# Patient Record
Sex: Male | Born: 1935 | Race: Black or African American | Hispanic: No | Marital: Married | Smoking: Never smoker
Health system: Southern US, Community
[De-identification: ages and names within clinical notes are randomized; demographics above are authoritative.]

## PROBLEM LIST (undated history)

## (undated) DIAGNOSIS — E7800 Pure hypercholesterolemia, unspecified: Secondary | ICD-10-CM

## (undated) HISTORY — PX: ROTATOR CUFF REPAIR: SHX139

## (undated) HISTORY — PX: COLON RESECTION: SHX5231

---

## 2015-04-24 ENCOUNTER — Inpatient Hospital Stay: Admit: 2015-04-24 | Payer: Self-pay | Admitting: Surgery

## 2015-04-24 ENCOUNTER — Encounter
Admission: EM | Disposition: A | Discharge status: Home / Self Care | Payer: Self-pay | Source: Home / Self Care | Attending: Surgery

## 2015-04-24 ENCOUNTER — Emergency Department: Payer: Medicare Other

## 2015-04-24 ENCOUNTER — Inpatient Hospital Stay: Payer: Medicare Other | Admitting: Anesthesiology

## 2015-04-24 ENCOUNTER — Inpatient Hospital Stay
Admission: EM | Admit: 2015-04-24 | Discharge: 2015-05-08 | DRG: 327 | Disposition: A | Discharge status: Home / Self Care | Payer: Medicare Other | Attending: Surgery | Admitting: Surgery

## 2015-04-24 ENCOUNTER — Encounter: Payer: Self-pay | Admitting: *Deleted

## 2015-04-24 DIAGNOSIS — K265 Chronic or unspecified duodenal ulcer with perforation: Principal | ICD-10-CM | POA: Diagnosis present

## 2015-04-24 DIAGNOSIS — B9681 Helicobacter pylori [H. pylori] as the cause of diseases classified elsewhere: Secondary | ICD-10-CM | POA: Diagnosis present

## 2015-04-24 DIAGNOSIS — J939 Pneumothorax, unspecified: Secondary | ICD-10-CM | POA: Diagnosis not present

## 2015-04-24 DIAGNOSIS — R400 Somnolence: Secondary | ICD-10-CM | POA: Diagnosis not present

## 2015-04-24 DIAGNOSIS — E785 Hyperlipidemia, unspecified: Secondary | ICD-10-CM | POA: Diagnosis present

## 2015-04-24 DIAGNOSIS — B9689 Other specified bacterial agents as the cause of diseases classified elsewhere: Secondary | ICD-10-CM | POA: Diagnosis present

## 2015-04-24 DIAGNOSIS — R000 Tachycardia, unspecified: Secondary | ICD-10-CM | POA: Diagnosis not present

## 2015-04-24 DIAGNOSIS — D72829 Elevated white blood cell count, unspecified: Secondary | ICD-10-CM | POA: Diagnosis present

## 2015-04-24 DIAGNOSIS — K91841 Postprocedural hemorrhage and hematoma of a digestive system organ or structure following other procedure: Secondary | ICD-10-CM | POA: Diagnosis present

## 2015-04-24 DIAGNOSIS — E780 Pure hypercholesterolemia: Secondary | ICD-10-CM | POA: Diagnosis present

## 2015-04-24 DIAGNOSIS — R198 Other specified symptoms and signs involving the digestive system and abdomen: Secondary | ICD-10-CM

## 2015-04-24 DIAGNOSIS — R69 Illness, unspecified: Secondary | ICD-10-CM | POA: Diagnosis present

## 2015-04-24 DIAGNOSIS — R079 Chest pain, unspecified: Secondary | ICD-10-CM | POA: Diagnosis present

## 2015-04-24 DIAGNOSIS — K631 Perforation of intestine (nontraumatic): Secondary | ICD-10-CM | POA: Diagnosis present

## 2015-04-24 DIAGNOSIS — D649 Anemia, unspecified: Secondary | ICD-10-CM | POA: Diagnosis not present

## 2015-04-24 DIAGNOSIS — R140 Abdominal distension (gaseous): Secondary | ICD-10-CM

## 2015-04-24 DIAGNOSIS — K254 Chronic or unspecified gastric ulcer with hemorrhage: Secondary | ICD-10-CM | POA: Diagnosis present

## 2015-04-24 DIAGNOSIS — I959 Hypotension, unspecified: Secondary | ICD-10-CM | POA: Diagnosis not present

## 2015-04-24 DIAGNOSIS — K769 Liver disease, unspecified: Secondary | ICD-10-CM | POA: Diagnosis present

## 2015-04-24 DIAGNOSIS — R066 Hiccough: Secondary | ICD-10-CM | POA: Diagnosis not present

## 2015-04-24 DIAGNOSIS — E440 Moderate protein-calorie malnutrition: Secondary | ICD-10-CM | POA: Diagnosis present

## 2015-04-24 DIAGNOSIS — K275 Chronic or unspecified peptic ulcer, site unspecified, with perforation: Secondary | ICD-10-CM | POA: Diagnosis not present

## 2015-04-24 DIAGNOSIS — R0602 Shortness of breath: Secondary | ICD-10-CM

## 2015-04-24 DIAGNOSIS — T82594S Other mechanical complication of infusion catheter, sequela: Secondary | ICD-10-CM

## 2015-04-24 HISTORY — PX: REPAIR OF PERFORATED ULCER: SHX6065

## 2015-04-24 HISTORY — DX: Pure hypercholesterolemia, unspecified: E78.00

## 2015-04-24 LAB — COMPREHENSIVE METABOLIC PANEL
ALT: 17 U/L (ref 17–63)
ANION GAP: 11 (ref 5–15)
AST: 33 U/L (ref 15–41)
Albumin: 4.2 g/dL (ref 3.5–5.0)
Alkaline Phosphatase: 44 U/L (ref 38–126)
BILIRUBIN TOTAL: 1 mg/dL (ref 0.3–1.2)
BUN: 14 mg/dL (ref 6–20)
CALCIUM: 9 mg/dL (ref 8.9–10.3)
CO2: 27 mmol/L (ref 22–32)
CREATININE: 1.15 mg/dL (ref 0.61–1.24)
Chloride: 101 mmol/L (ref 101–111)
GFR calc Af Amer: >60 mL/min (ref >60)
GFR calc non Af Amer: 58 mL/min — ABNORMAL LOW (ref >60)
Glucose, Bld: 201 mg/dL — ABNORMAL HIGH (ref 65–99)
Potassium: 4 mmol/L (ref 3.5–5.1)
SODIUM: 139 mmol/L (ref 135–145)
TOTAL PROTEIN: 8.2 g/dL — AB (ref 6.5–8.1)

## 2015-04-24 LAB — CBC WITH DIFFERENTIAL/PLATELET
Basophils Absolute: 0 10*3/uL (ref 0–0.1)
Basophils Relative: 0 %
Eosinophils Absolute: 0 10*3/uL (ref 0–0.7)
Eosinophils Relative: 0 %
HEMATOCRIT: 42.4 % (ref 40.0–52.0)
Hemoglobin: 13.9 g/dL (ref 13.0–18.0)
LYMPHS ABS: 1.5 10*3/uL (ref 1.0–3.6)
Lymphocytes Relative: 13 %
MCH: 31.6 pg (ref 26.0–34.0)
MCHC: 32.8 g/dL (ref 32.0–36.0)
MCV: 96.5 fL (ref 80.0–100.0)
Monocytes Absolute: 0.4 10*3/uL (ref 0.2–1.0)
Monocytes Relative: 4 %
Neutro Abs: 9.6 10*3/uL — ABNORMAL HIGH (ref 1.4–6.5)
Neutrophils Relative %: 83 %
Platelets: 250 10*3/uL (ref 150–440)
RBC: 4.39 MIL/uL — ABNORMAL LOW (ref 4.40–5.90)
RDW: 14.3 % (ref 11.5–14.5)
WBC: 11.5 10*3/uL — ABNORMAL HIGH (ref 3.8–10.6)

## 2015-04-24 LAB — GLUCOSE, CAPILLARY: Glucose-Capillary: 185 mg/dL — ABNORMAL HIGH (ref 65–99)

## 2015-04-24 LAB — TROPONIN I

## 2015-04-24 SURGERY — REPAIR, ULCER, PEPTIC, PERFORATED
Anesthesia: General | Wound class: Clean Contaminated

## 2015-04-24 MED ORDER — ENOXAPARIN SODIUM 40 MG/0.4ML ~~LOC~~ SOLN
40.0000 mg | SUBCUTANEOUS | Status: DC
  Administered 2015-04-25 – 2015-04-29 (×5): 40 mg via SUBCUTANEOUS
  Filled 2015-04-24 (×6): qty 0.4

## 2015-04-24 MED ORDER — MORPHINE SULFATE 4 MG/ML IJ SOLN
4.0000 mg | Freq: Once | INTRAMUSCULAR | Status: AC
  Administered 2015-04-24: 4 mg via INTRAVENOUS

## 2015-04-24 MED ORDER — MORPHINE SULFATE 4 MG/ML IJ SOLN
INTRAMUSCULAR | Status: AC
  Administered 2015-04-24: 4 mg via INTRAVENOUS
  Filled 2015-04-24: qty 1

## 2015-04-24 MED ORDER — SODIUM CHLORIDE 0.9 % IV SOLN
INTRAVENOUS | Status: DC | PRN
  Administered 2015-04-24 – 2015-04-25 (×2): via INTRAVENOUS

## 2015-04-24 MED ORDER — FENTANYL CITRATE (PF) 100 MCG/2ML IJ SOLN: INTRAMUSCULAR | Status: DC | PRN

## 2015-04-24 MED ORDER — IOHEXOL 240 MG/ML SOLN
25.0000 mL | Freq: Once | INTRAMUSCULAR | Status: AC | PRN
  Administered 2015-04-24: 25 mL via ORAL

## 2015-04-24 MED ORDER — ONDANSETRON HCL 4 MG/2ML IJ SOLN
INTRAMUSCULAR | Status: AC
  Administered 2015-04-24: 4 mg via INTRAVENOUS
  Filled 2015-04-24: qty 2

## 2015-04-24 MED ORDER — HYDROMORPHONE HCL 1 MG/ML IJ SOLN: 1.0000 mg | INTRAMUSCULAR | Status: DC | PRN

## 2015-04-24 MED ORDER — PIPERACILLIN-TAZOBACTAM 4.5 G IVPB
4.5000 g | Freq: Once | INTRAVENOUS | Status: AC
  Administered 2015-04-24: 4.5 g via INTRAVENOUS
  Filled 2015-04-24: qty 100

## 2015-04-24 MED ORDER — SODIUM CHLORIDE 0.9 % IV BOLUS (SEPSIS)
1000.0000 mL | Freq: Once | INTRAVENOUS | Status: AC
  Administered 2015-04-24: 1000 mL via INTRAVENOUS

## 2015-04-24 MED ORDER — PHENYLEPHRINE HCL 10 MG/ML IJ SOLN
INTRAMUSCULAR | Status: DC | PRN
  Administered 2015-04-24 (×3): 100 ug via INTRAVENOUS

## 2015-04-24 MED ORDER — KCL IN DEXTROSE-NACL 20-5-0.45 MEQ/L-%-% IV SOLN
INTRAVENOUS | Status: DC
  Administered 2015-04-25 – 2015-04-26 (×4): via INTRAVENOUS
  Administered 2015-04-27: 1000 mL via INTRAVENOUS
  Administered 2015-04-27 – 2015-04-30 (×5): via INTRAVENOUS
  Filled 2015-04-24 (×18): qty 1000

## 2015-04-24 MED ORDER — ONDANSETRON HCL 4 MG/2ML IJ SOLN
4.0000 mg | Freq: Once | INTRAMUSCULAR | Status: AC
  Administered 2015-04-24: 4 mg via INTRAVENOUS

## 2015-04-24 MED ORDER — IOHEXOL 300 MG/ML  SOLN
100.0000 mL | Freq: Once | INTRAMUSCULAR | Status: AC | PRN
  Administered 2015-04-24: 100 mL via INTRAVENOUS

## 2015-04-24 MED ORDER — SODIUM CHLORIDE 0.9 % IV SOLN
1.0000 g | INTRAVENOUS | Status: DC
  Administered 2015-04-25: 1 g via INTRAVENOUS
  Filled 2015-04-24: qty 1

## 2015-04-24 SURGICAL SUPPLY — 47 items
CANISTER SUCT 1200ML W/VALVE (MISCELLANEOUS) ×3 IMPLANT
CATH TRAY 16F METER LATEX (MISCELLANEOUS) ×3 IMPLANT
CLIP TI LARGE 6 (CLIP) IMPLANT
CLIP TI MEDIUM 6 (CLIP) IMPLANT
COVER CLAMP SIL LG PBX B (MISCELLANEOUS) IMPLANT
DRAPE INCISE IOBAN 66X45 STRL (DRAPES) ×3 IMPLANT
DRAPE LAPAROTOMY 100X77 ABD (DRAPES) ×3 IMPLANT
DRSG OPSITE POSTOP 4X10 (GAUZE/BANDAGES/DRESSINGS) ×6 IMPLANT
DRSG OPSITE POSTOP 4X8 (GAUZE/BANDAGES/DRESSINGS) ×3 IMPLANT
ELECT CAUTERY NEEDLE TIP 1.0 (MISCELLANEOUS)
ELECTRODE CAUTERY NEDL TIP 1.0 (MISCELLANEOUS) IMPLANT
GAUZE SPONGE 4X4 12PLY STRL (GAUZE/BANDAGES/DRESSINGS) ×3 IMPLANT
GLOVE BIO SURGEON STRL SZ7.5 (GLOVE) ×12 IMPLANT
GLOVE INDICATOR 8.0 STRL GRN (GLOVE) ×12 IMPLANT
GOWN STRL REUS W/ TWL LRG LVL3 (GOWN DISPOSABLE) ×12 IMPLANT
GOWN STRL REUS W/TWL LRG LVL3 (GOWN DISPOSABLE) ×6
HANDLE YANKAUER SUCT BULB TIP (MISCELLANEOUS) ×3 IMPLANT
KIT RM TURNOVER STRD PROC AR (KITS) ×3 IMPLANT
LABEL OR SOLS (LABEL) ×3 IMPLANT
LIGASURE 5MM LAPAROSCOPIC (INSTRUMENTS) IMPLANT
NS IRRIG 1000ML POUR BTL (IV SOLUTION) ×3 IMPLANT
PACK BASIN MAJOR ARMC (MISCELLANEOUS) ×3 IMPLANT
PACK COLON CLEAN CLOSURE (MISCELLANEOUS) ×3 IMPLANT
PAD ABD DERMACEA PRESS 5X9 (GAUZE/BANDAGES/DRESSINGS) ×3 IMPLANT
PAD GROUND ADULT SPLIT (MISCELLANEOUS) ×3 IMPLANT
RETAINER VISCERA MED (MISCELLANEOUS) ×3 IMPLANT
SPONGE LAP 18X18 5 PK (GAUZE/BANDAGES/DRESSINGS) IMPLANT
STAPLER SKIN PROX 35W (STAPLE) ×3 IMPLANT
STRIP CLOSURE SKIN 1/2X4 (GAUZE/BANDAGES/DRESSINGS) ×12 IMPLANT
SUT BOLSTER W/RETENTN TUBE (SUTURE) IMPLANT
SUT ETHILON 3-0 FS-10 30 BLK (SUTURE)
SUT MAXON ABS #0 GS21 30IN (SUTURE) ×12 IMPLANT
SUT NYLON 2-0 (SUTURE) IMPLANT
SUT PROLENE 2 TP 1 (SUTURE) IMPLANT
SUT SILK 2 0 (SUTURE)
SUT SILK 2-0 18XBRD TIE 12 (SUTURE) IMPLANT
SUT SILK 3 0 (SUTURE)
SUT SILK 3-0 (SUTURE) ×2
SUT SILK 3-0 18XBRD TIE 12 (SUTURE) IMPLANT
SUT SILK 3-0 SH-1 18XCR BRD (SUTURE) ×4
SUT SILK 4 0 (SUTURE)
SUT SILK 4-0 18XBRD TIE 12 (SUTURE) IMPLANT
SUT VIC AB 3-0 SH 27 (SUTURE) ×2
SUT VIC AB 3-0 SH 27X BRD (SUTURE) ×4 IMPLANT
SUT VIC AB 4-0 FS2 27 (SUTURE) IMPLANT
SUTURE EHLN 3-0 FS-10 30 BLK (SUTURE) IMPLANT
SUTURE SILK 3-0 SH-1 18XCR BRD (SUTURE) ×4 IMPLANT

## 2015-04-24 NOTE — Anesthesia Preprocedure Evaluation (Addendum)
Anesthesia Evaluation  Patient identified by MRN, date of birth, ID band Patient awake    Reviewed: Allergy & Precautions, NPO status , Patient's Chart, lab work & pertinent test results  Airway Mallampati: III       Dental no notable dental hx.    Pulmonary neg pulmonary ROS,    Pulmonary exam normal       Cardiovascular negative cardio ROS  Rhythm:Regular     Neuro/Psych    GI/Hepatic negative GI ROS, Neg liver ROS,   Endo/Other  negative endocrine ROS  Renal/GU negative Renal ROS  negative genitourinary   Musculoskeletal negative musculoskeletal ROS (+)   Abdominal Normal abdominal exam  (+)   Peds negative pediatric ROS (+)  Hematology negative hematology ROS (+)   Anesthesia Other Findings   Reproductive/Obstetrics negative OB ROS                            Anesthesia Physical Anesthesia Plan  ASA: II and emergent  Anesthesia Plan: General   Post-op Pain Management:    Induction: Intravenous  Airway Management Planned: Oral ETT  Additional Equipment:   Intra-op Plan:   Post-operative Plan: Extubation in OR  Informed Consent: I have reviewed the patients History and Physical, chart, labs and discussed the procedure including the risks, benefits and alternatives for the proposed anesthesia with the patient or authorized representative who has indicated his/her understanding and acceptance.     Plan Discussed with: CRNA  Anesthesia Plan Comments:         Anesthesia Quick Evaluation

## 2015-04-24 NOTE — ED Notes (Signed)
Patient reports having abdominal pain in the lower quads.  Tender to the touch.  Per family, he was vomiting last night and only ate a bagel and a drank a coffee this morning.  Traveling from [place]. Upon arrival patient diaphoretic. CBG=185.  Pain 8/10

## 2015-04-24 NOTE — ED Notes (Addendum)
Blood not needed at this time. Already drawn previously.

## 2015-04-24 NOTE — ED Provider Notes (Addendum)
[facility] Emergency Department Provider Note  ____________________________________________  Time seen: Approximately 430 PM  I have reviewed the triage vital signs and the nursing notes.   HISTORY  Chief Complaint Chest Pain    HPI Michael Lowe is a 81 y.o. male with a history of HLD who presents to the emergency department today for 1 day of vomiting and abdominal pain. The patient began vomiting yesterday and had several vomiting episodes overnight. He was able to tolerate a bagel this morning but continued to have pain. He says that the pain is cramping and sharp and located to the lower abdomen. He denies any pain in his penis or testicles. He denies any dysuria. No diarrhea. No known sick contacts. No bilious or bloody vomitus. Has not had any abdominal surgeries. Patient is from [place] and does not have any established medical care in this region.   Past Medical History  Diagnosis Date  . Hypercholesteremia     There are no active problems to display for this patient.   Past Surgical History  Procedure Laterality Date  . Rotator cuff repair      Current Outpatient Rx  Name  Route  Sig  Dispense  Refill  . aspirin EC 81 MG tablet   Oral   Take 81 mg by mouth daily.         . fluticasone (FLONASE) 50 MCG/ACT nasal spray   Each Nare   Place 1 spray into both nostrils daily.      5   . meloxicam (MOBIC) 7.5 MG tablet   Oral   Take 1 tablet by mouth daily.      0   . simvastatin (ZOCOR) 40 MG tablet   Oral   Take 1 tablet by mouth at bedtime.      3     Allergies Review of patient's allergies indicates no known allergies.  History reviewed. No pertinent family history.  Social History History  Substance Use Topics  . Smoking status: Never Smoker   . Smokeless tobacco: Not on file  . Alcohol Use: Yes    Review of Systems Constitutional: No fever/chills Eyes: No visual changes. ENT: No sore  throat. Cardiovascular: Denies chest pain. Respiratory: Denies shortness of breath. Gastrointestinal:  No diarrhea.  No constipation. Genitourinary: Negative for dysuria. Musculoskeletal: Negative for back pain. Skin: Negative for rash. Neurological: Negative for headaches, focal weakness or numbness.  10-point ROS otherwise negative.  ____________________________________________   PHYSICAL EXAM:  VITAL SIGNS: ED Triage Vitals  Enc Vitals Group     BP 04/24/15 1601 138/66 mmHg     Pulse Rate 04/24/15 1601 67     Resp 04/24/15 1601 20     Temp 04/24/15 1601 98.1 F (36.7 C)     Temp Source 04/24/15 1601 Oral     SpO2 04/24/15 1601 97 %     Weight 04/24/15 1601 160 lb (72.576 kg)     Height 04/24/15 1601 6' (1.829 m)     Head Cir --      Peak Flow --      Pain Score 04/24/15 1558 8     Pain Loc --      Pain Edu? --      Excl. in GC? --     Constitutional: Alert and oriented. Well appearing and in no acute distress. Eyes: Conjunctivae are normal. PERRL. EOMI. Head: Atraumatic. Nose: No congestion/rhinnorhea. Mouth/Throat: Mucous membranes are moist.  Oropharynx non-erythematous. Neck: No stridor.   Cardiovascular:  Normal rate, regular rhythm. Grossly normal heart sounds.  Good peripheral circulation. Respiratory: Normal respiratory effort.  No retractions. Lungs CTAB. Gastrointestinal: Patient is guarding with diffuse tenderness. No distention.  No CVA tenderness. Musculoskeletal: No lower extremity tenderness nor edema.  No joint effusions. Neurologic:  Normal speech and language. No gross focal neurologic deficits are appreciated. Speech is normal. No gait instability. Skin:  Skin is warm, dry and intact. No rash noted. Psychiatric: Mood and affect are normal. Speech and behavior are normal.  ____________________________________________   LABS (all labs ordered are listed, but only abnormal results are displayed)  Labs Reviewed  GLUCOSE, CAPILLARY - Abnormal;  Notable for the following:    Glucose-Capillary 185 (*)    All other components within normal limits  CBC WITH DIFFERENTIAL/PLATELET - Abnormal; Notable for the following:    WBC 11.5 (*)    RBC 4.39 (*)    Neutro Abs 9.6 (*)    All other components within normal limits  COMPREHENSIVE METABOLIC PANEL - Abnormal; Notable for the following:    Glucose, Bld 201 (*)    Total Protein 8.2 (*)    GFR calc non Af Amer 58 (*)    All other components within normal limits  TROPONIN I   ____________________________________________  EKG  ED ECG REPORT I, Arelia LongestSchaevitz,  Banita Lehn M, the attending physician, personally viewed and interpreted this ECG.   Date: 04/24/2015  EKG Time: 1604  Rate: 70  Rhythm: normal sinus rhythm  Axis: Normal axis  Intervals:none  ST&T Change: No  ST elevations or depressions. No abnormal T-wave inversions.  ____________________________________________  RADIOLOGY  CAT scan of the abdomen with findings consistent with perforation of viscus. Unclear site but most suspicious along the greater curvature of the stomach and loop of small bowel in the left upper quadrant of the abdomen. ____________________________________________   PROCEDURES    ____________________________________________   INITIAL IMPRESSION / ASSESSMENT AND PLAN / ED COURSE  Pertinent labs & imaging results that were available during my care of the patient were reviewed by me and considered in my medical decision making (see chart for details).  ----------------------------------------- 8:48 PM on 04/24/2015 -----------------------------------------  Patient resting comfortably at this time. Reviewed the CAT scan results with the patient as well as the family. Family initially hesitant to stay here at [facility] because they're from [place]. However, I discussed this being a medical emergency requiring surgery and that delaying his treatment because of transport could result in catastrophic  consequences. The family is understanding and willingness to stay here and speak with Dr. Michela PitcherEly when he is out of surgery. I discussed the radiology findings with Dr. Michela PitcherEly and he will be down to see the patient as soon as he is out of surgery. ____________________________________________   FINAL CLINICAL IMPRESSION(S) / ED DIAGNOSES  Acute perforated viscus. Initial visit.      Myrna Blazeravid Matthew Jaelene Garciagarcia, MD 04/24/15 2051  At 920 the family decided that they would like to be transferred to [facility] because the patient has had past care there and they feel that the hospital has more staff to take care of the patient. I had an extensive conversation with the patient as well as his wife and daughter about the seriousness of his medical condition and the high risk of death even with prompt treatment. The family is insistent on being transferred.  I discussed the case with Dr.Weilbach, the surgeon on call at [facility] in [place] refused the transfer because of the patient's diagnosis  and risk for delayed of care. She did say that she would be willing to take the patient after surgery here at [facility]. I told Dr. Michela Pitcher about my conversation with Dr. Darnelle Catalan and he will discuss this issue further with the family. I called Dr. Michela Pitcher at 10 PM and he is now out of the operating room and says he will be in the emergency department within 15-20 minutes. Patient remains stable at this time with a normal heart rate and blood pressure. Family was updated about the [facility] hospital's refusal as well as willingness to patient after surgery and the time that Dr. Michela Pitcher should be down in the emergency department to see the patient.  Myrna Blazer, MD 04/24/15 [phone]

## 2015-04-24 NOTE — ED Notes (Signed)
States last c/o mid abd pain, has vomited,also developed ingestion feeling in chest

## 2015-04-24 NOTE — ED Notes (Signed)
Called the lab to find out where troponin and CMP results were. Said they were going to run them now.

## 2015-04-24 NOTE — ED Notes (Signed)
Called into patient's room due to patient's wife wanting to speak with EDP because she wants to sign patient out AMA and drive patient to [facility] [place].  Dr. Pershing ProudSchaevitz notified and went immediately to bedside to discuss with patient and family.

## 2015-04-24 NOTE — H&P (Signed)
Michael Lowe is a 81 y.o. male  24-36 hours of abdominal pain.  HPI: He was in usual state of good health until approximately 36 hours ago and began to develop some significant abdominal pain. He denies any fever or chills. He vomited profusely. He attended a funeral this afternoon and [facility] and was going home to [facility]. Because of his increasing abdominal pain he encouraged his family to stop in our facility for further evaluation.  He denies any previous similar abdominal problems. Denies history of hepatitis yellow jaundice pancreatitis peptic ulcer disease gallbladder disease or diverticulitis. He has had a previous colonoscopy 3 years ago which was unremarkable. The patient denies any previous abdominal surgery.  Workup in the emergency room slightly elevated white blood cell count. CT scan with by mouth contrast demonstrated significant amount of free air or free fluid and some thickened small bowel consistent with probable perforation. The possibility of peptic ulcer disease was felt to be much lower possibility. The surgical service was consulted.  Past Medical History  Diagnosis Date  . Hypercholesteremia    Past Surgical History  Procedure Laterality Date  . Rotator cuff repair     History   Social History  . Marital Status: Married    Spouse Name: N/A  . Number of Children: N/A  . Years of Education: N/A   Social History Main Topics  . Smoking status: Never Smoker   . Smokeless tobacco: Not on file  . Alcohol Use: Yes  . Drug Use: Not on file  . Sexual Activity: Not on file   Other Topics Concern  . None   Social History Narrative  . None     Review of Systems  Constitutional: Negative for fever, chills, weight loss and malaise/fatigue.  HENT: Negative for congestion.   Eyes: Negative for blurred vision and double vision.  Respiratory: Negative for cough.        Pain on deep inspiration  Cardiovascular: Negative for chest pain and  palpitations.  Gastrointestinal: Positive for nausea, vomiting and abdominal pain. Negative for heartburn, diarrhea and constipation.  Genitourinary: Positive for frequency. Negative for dysuria.  Musculoskeletal: Negative for back pain and joint pain.  Skin: Negative for itching and rash.  Neurological: Negative for dizziness, speech change, focal weakness and headaches.  Endo/Heme/Allergies: Negative for environmental allergies. Does not bruise/bleed easily.  Psychiatric/Behavioral: Negative for depression and memory loss. The patient does not have insomnia.      PHYSICAL EXAM: BP 136/77 mmHg  Pulse 82  Temp(Src) 98.1 F (36.7 C) (Oral)  Resp 21  Ht 6' (1.829 m)  Wt 72.576 kg (160 lb)  BMI 21.70 kg/m2  SpO2 99%  Physical Exam  Constitutional: He is oriented to person, place, and time. He appears well-developed. He appears distressed.  HENT:  Head: Atraumatic.  Eyes: EOM are normal. Pupils are equal, round, and reactive to light.  Neck: Normal range of motion. Neck supple.  Cardiovascular: Normal rate and normal heart sounds.   Pulmonary/Chest: Effort normal and breath sounds normal.  Pain was deep inspiration  Abdominal: He exhibits distension. There is tenderness. There is rebound and guarding.  Marked abdominal tenderness with a boardlike abdomen significant guarding in both lower quadrants with mild rebound throughout. He has hypoactive bowel sounds.  Musculoskeletal: Normal range of motion.  Neurological: He is alert and oriented to person, place, and time.  Skin: Skin is warm and dry.  Psychiatric: He has a normal mood and affect. His behavior is normal.  I have independently reviewed his CT scan. The does appear to be significant amount of free air or free fluid. There is no obvious site of perforation. His colon and stomach along with the duodenum looked uninvolved. The site of bowel thickening appears to be in left upper quadrant and small intestine. He does have a  boardlike abdomen slightly elevated white blood cell count.   Impression/Plan: He clearly has abdominal perforation. We have recommended exploratory laparotomy. He is from [place] and the family wanted transfer to [place] facility. Emergency room physicians contacted to [facility] facility in the on-call surgeon refused transfer in this setting. I certainly understand the concern with a life-threatening illness this situation. I recommended surgical intervention. We've talked with him at length and they have agreed. Risks benefits and options been outlined and accepted. His wife and daughter were present for the interview.   Tiney Rouge III, MD  04/24/2015, 10:32 PM

## 2015-04-24 NOTE — ED Notes (Signed)
Ct notified patient completed contrast. 

## 2015-04-25 ENCOUNTER — Encounter: Payer: Self-pay | Admitting: Surgery

## 2015-04-25 DIAGNOSIS — K275 Chronic or unspecified peptic ulcer, site unspecified, with perforation: Secondary | ICD-10-CM

## 2015-04-25 LAB — BASIC METABOLIC PANEL
Anion gap: 7 (ref 5–15)
BUN: 20 mg/dL (ref 6–20)
CO2: 22 mmol/L (ref 22–32)
Calcium: 7.5 mg/dL — ABNORMAL LOW (ref 8.9–10.3)
Chloride: 107 mmol/L (ref 101–111)
Creatinine, Ser: 1.09 mg/dL (ref 0.61–1.24)
GFR calc Af Amer: >60 mL/min (ref >60)
Glucose, Bld: 213 mg/dL — ABNORMAL HIGH (ref 65–99)
POTASSIUM: 4.8 mmol/L (ref 3.5–5.1)
Sodium: 136 mmol/L (ref 135–145)

## 2015-04-25 LAB — CBC
HEMATOCRIT: 40.4 % (ref 40.0–52.0)
HEMOGLOBIN: 13.3 g/dL (ref 13.0–18.0)
MCH: 31.9 pg (ref 26.0–34.0)
MCHC: 33 g/dL (ref 32.0–36.0)
MCV: 96.7 fL (ref 80.0–100.0)
Platelets: 208 10*3/uL (ref 150–440)
RBC: 4.18 MIL/uL — ABNORMAL LOW (ref 4.40–5.90)
RDW: 14.5 % (ref 11.5–14.5)
WBC: 7 10*3/uL (ref 3.8–10.6)

## 2015-04-25 MED ORDER — ROCURONIUM BROMIDE 100 MG/10ML IV SOLN
INTRAVENOUS | Status: DC | PRN
  Administered 2015-04-24: 40 mg via INTRAVENOUS

## 2015-04-25 MED ORDER — HYDROMORPHONE HCL 1 MG/ML IJ SOLN
1.0000 mg | INTRAMUSCULAR | Status: DC | PRN
  Administered 2015-04-25 – 2015-04-27 (×7): 1 mg via INTRAVENOUS
  Filled 2015-04-25 (×7): qty 1

## 2015-04-25 MED ORDER — FENTANYL CITRATE (PF) 100 MCG/2ML IJ SOLN
INTRAMUSCULAR | Status: DC | PRN
  Administered 2015-04-24 – 2015-04-25 (×3): 50 ug via INTRAVENOUS

## 2015-04-25 MED ORDER — NEOSTIGMINE METHYLSULFATE 10 MG/10ML IV SOLN
INTRAVENOUS | Status: DC | PRN
  Administered 2015-04-25: 3 mg via INTRAVENOUS

## 2015-04-25 MED ORDER — ONDANSETRON HCL 4 MG/2ML IJ SOLN
4.0000 mg | Freq: Once | INTRAMUSCULAR | Status: DC | PRN
Start: 2015-04-25 — End: 2015-04-25

## 2015-04-25 MED ORDER — FAMOTIDINE IN NACL 20-0.9 MG/50ML-% IV SOLN
20.0000 mg | Freq: Two times a day (BID) | INTRAVENOUS | Status: DC
  Administered 2015-04-25 – 2015-04-29 (×10): 20 mg via INTRAVENOUS
  Filled 2015-04-25 (×13): qty 50

## 2015-04-25 MED ORDER — FENTANYL CITRATE (PF) 100 MCG/2ML IJ SOLN
INTRAMUSCULAR | Status: AC
  Filled 2015-04-25: qty 2

## 2015-04-25 MED ORDER — CEFAZOLIN SODIUM 1-5 GM-% IV SOLN: INTRAVENOUS | Status: DC | PRN

## 2015-04-25 MED ORDER — ONDANSETRON HCL 4 MG PO TABS: 4.0000 mg | ORAL_TABLET | Freq: Four times a day (QID) | ORAL | Status: DC | PRN

## 2015-04-25 MED ORDER — SUCCINYLCHOLINE CHLORIDE 20 MG/ML IJ SOLN
INTRAMUSCULAR | Status: DC | PRN
  Administered 2015-04-24: 100 mg via INTRAVENOUS

## 2015-04-25 MED ORDER — SODIUM CHLORIDE 0.9 % IV SOLN
1.0000 g | INTRAVENOUS | Status: DC
  Administered 2015-04-25 – 2015-04-30 (×6): 1 g via INTRAVENOUS
  Filled 2015-04-25 (×8): qty 1

## 2015-04-25 MED ORDER — CEFAZOLIN SODIUM 1-5 GM-% IV SOLN
INTRAVENOUS | Status: DC | PRN
  Administered 2015-04-24: 1 g via INTRAVENOUS

## 2015-04-25 MED ORDER — ONDANSETRON HCL 4 MG/2ML IJ SOLN
4.0000 mg | Freq: Four times a day (QID) | INTRAMUSCULAR | Status: DC | PRN
  Administered 2015-04-27 – 2015-04-30 (×3): 4 mg via INTRAVENOUS
  Filled 2015-04-25 (×3): qty 2

## 2015-04-25 MED ORDER — ONDANSETRON HCL 4 MG/2ML IJ SOLN
INTRAMUSCULAR | Status: DC | PRN
  Administered 2015-04-25: 4 mg via INTRAVENOUS

## 2015-04-25 MED ORDER — GLYCOPYRROLATE 0.2 MG/ML IJ SOLN
INTRAMUSCULAR | Status: DC | PRN
  Administered 2015-04-25: 0.4 mg via INTRAVENOUS

## 2015-04-25 MED ORDER — FENTANYL CITRATE (PF) 100 MCG/2ML IJ SOLN
25.0000 ug | INTRAMUSCULAR | Status: AC | PRN
  Administered 2015-04-25 (×6): 25 ug via INTRAVENOUS

## 2015-04-25 MED ORDER — PROPOFOL 10 MG/ML IV BOLUS
INTRAVENOUS | Status: DC | PRN
  Administered 2015-04-24: 150 mg via INTRAVENOUS

## 2015-04-25 MED ORDER — SODIUM CHLORIDE 0.9 % IV BOLUS (SEPSIS)
1000.0000 mL | Freq: Once | INTRAVENOUS | Status: AC
  Administered 2015-04-25: 1000 mL via INTRAVENOUS

## 2015-04-25 MED ORDER — ACETAMINOPHEN 325 MG PO TABS: 650.0000 mg | ORAL_TABLET | Freq: Four times a day (QID) | ORAL | Status: DC | PRN

## 2015-04-25 NOTE — Transfer of Care (Signed)
Immediate Anesthesia Transfer of Care Note  Patient: Michael Lowe  Procedure(s) Performed: Procedure(s): COLON RESECTION (N/A)  Patient Location: PACU  Anesthesia Type:General  Level of Consciousness: awake, alert , oriented and patient cooperative  Airway & Oxygen Therapy: Patient Spontanous Breathing and Patient connected to face mask oxygen  Post-op Assessment: Report given to RN and Post -op Vital signs reviewed and stable  Post vital signs: Reviewed and stable  Last Vitals:  Filed Vitals:   04/24/15 2300  BP: 152/98  Pulse: 80  Temp:   Resp: 22    Complications: No apparent anesthesia complications

## 2015-04-25 NOTE — Anesthesia Postprocedure Evaluation (Signed)
  Anesthesia Post-op Note  Patient: [name]  Procedure(s) Performed: Procedure(s) with comments: REPAIR OF PERFORATED ULCER - duodenal  Anesthesia type:General  Patient location: PACU  Post pain: Pain level controlled  Post assessment: Post-op Vital signs reviewed, Patient's Cardiovascular Status Stable, Respiratory Function Stable, Patent Airway and No signs of Nausea or vomiting  Post vital signs: Reviewed and stable  Last Vitals:  Filed Vitals:   04/25/15 0108  BP:   Pulse:   Temp: 37.1 C  Resp:     Level of consciousness: awake, alert  and patient cooperative  Complications: No apparent anesthesia complications

## 2015-04-25 NOTE — Op Note (Signed)
04/24/2015 - 04/25/2015  12:51 AM  PATIENT:  Michael Lowe  81 y.o. male  PRE-OPERATIVE DIAGNOSIS:  perforated bowel  POST-OPERATIVE DIAGNOSIS:  perforated duodenal ulcer  PROCEDURE:  Procedure(s): COLON RESECTION (N/A)  SURGEON:  Surgeon(s) and Role:    * Tiney Rougealph Ely III, MD - Primary   ASSISTANTS: none   ANESTHESIA:   general  EBL:  Total I/O In: 1000 [I.V.:1000] Out: 200 [Urine:150; Blood:50]   DRAINS: none   LOCAL MEDICATIONS USED:  NONE   DISPOSITION OF SPECIMEN:  N/A   DICTATION: .Dragon Dictation with the patient in supine position after the induction of appropriate general anesthesia the patient's abdomen was prepped with Betadine and draped sterile towels. Alcohol wipe and died impregnated Steri-Drape were utilized.  A midline incision was made from just above the umbilicus to below the umbilicus so that the source of perforation might be identified. Incision was carried down to the subcutaneous taste tissue using Bovie cautery. Midline fascia identified no the length skin incision as well as peritoneum. There was an escape of free air at opening of the peritoneum. There was large amount of bilious material. The small bowel was run from the ligament of Treitz to the ileocecal valve and while there was marked fibrinous exudate does not appear to be any bowel thickening or any evidence of perforation. There were no mesenteric injuries. The colon was palpated and did not appear to be involved.  The upper abdomen was then examined and did appear to be an anterior perforation just above the pylorus along the lesser curvature. Large amount of free bilious material was exiting the defect. The defect was approximately 2 cm in diameter.  The incision was then extended to the subxiphoid area and again carried down to the subcutaneous taste tissue with Bovie cautery opening the fascia and the midline peritoneum. A Balfour retractor was placed in the liver was retracted. The area  was irrigated and suctioned. The ulcer defect was closed primarily using 3-0 silk in a seromuscular fashion. The omentum was then sutured over the closure using 3-0 Vicryl as a Graham patch. The repair appeared be satisfactory.  The abdomen was then irrigated with multiple liters of warm saline solution. A nasogastric tube could not be placed. The patient had a previous ENT procedure and we could not navigate his oropharynx.  The midline fascia was closed with interrupted buried figure-of-eight sutures of 0 Maxon with small bites and placed close together. A visceral retractor was used to provide protection for the intestines. Skin was then clipped. Become dressing was applied. Patient returned recovery room having tolerated the procedure well. Sponge instrument needle count were correct 2 in the operative.  PLAN OF CARE: Admit to inpatient   PATIENT DISPOSITION:  PACU - guarded condition.   Tiney Rougealph Ely III, MD

## 2015-04-25 NOTE — Progress Notes (Signed)
1 Day Post-Op   Subjective:  Moderate abdominal pain but no nausea or vomiting. He feels much improved.  Vital signs in last 24 hours: Temp:  [98.1 F (36.7 C)-98.8 F (37.1 C)] 98.2 F (36.8 C) (06/28 0517) Pulse Rate:  [66-110] 101 (06/28 0517) Resp:  [14-29] 16 (06/28 0517) BP: (111-174)/(66-98) 111/66 mmHg (06/28 0517) SpO2:  [95 %-100 %] 95 % (06/28 0517) Weight:  [72.576 kg (160 lb)] 72.576 kg (160 lb) (06/27 1601) Last BM Date: 04/23/15  Intake/Output from previous day: 06/27 0701 - 06/28 0700 In: 1336 [I.V.:1136; IV Piggyback:200] Out: 230 [Urine:180; Blood:50]  GI: He has marked incisional tenderness minimal drainage with no bowel sounds at this time.  Lab Results:  CBC  Recent Labs  04/24/15 1634  WBC 11.5*  HGB 13.9  HCT 42.4  PLT 250   CMP     Component Value Date/Time   NA 139 04/24/2015 1634   K 4.0 04/24/2015 1634   CL 101 04/24/2015 1634   CO2 27 04/24/2015 1634   GLUCOSE 201* 04/24/2015 1634   BUN 14 04/24/2015 1634   CREATININE 1.15 04/24/2015 1634   CALCIUM 9.0 04/24/2015 1634   PROT 8.2* 04/24/2015 1634   ALBUMIN 4.2 04/24/2015 1634   AST 33 04/24/2015 1634   ALT 17 04/24/2015 1634   ALKPHOS 44 04/24/2015 1634   BILITOT 1.0 04/24/2015 1634   GFRNONAA 58* 04/24/2015 1634   GFRAA >60 04/24/2015 1634   PT/INR No results for input(s): LABPROT, INR in the last 72 hours.  Studies/Results: Dg Chest 1 View  04/24/2015   CLINICAL DATA:  Abdominal pain, vomiting.  Diaphoretic.  EXAM: CHEST  1 VIEW  COMPARISON:  None.  FINDINGS: There is hyperinflation of the lungs compatible with COPD. Heart is normal size. Bilateral lower lobe airspace opacities are noted, right greater than left. This could reflect atelectasis, but pneumonia is not excluded, particularly in the right lung base. No effusions. No acute bony abnormality.  IMPRESSION: Bibasilar atelectasis or infiltrates, right greater than left. Cannot exclude pneumonia.   Electronically Signed    By: Charlett Nose M.D.   On: 04/24/2015 17:20   Ct Abdomen Pelvis W Contrast  04/24/2015   CLINICAL DATA:  Abdominal pain and nausea beginning 04/23/2015. No known injury. Initial encounter.  EXAM: CT ABDOMEN AND PELVIS WITH CONTRAST  TECHNIQUE: Multidetector CT imaging of the abdomen and pelvis was performed using the standard protocol following bolus administration of intravenous contrast.  CONTRAST:  OMNIPAQUE IOHEXOL 300 MG/ML SOLN, 25 mL OMNIPAQUE IOHEXOL 240 MG/ML SOLN  COMPARISON:  None.  FINDINGS: Lung bases demonstrate a extensive bilateral pleural plaquing. Small hiatal hernia is noted. Heart size is upper normal. No pleural or pericardial effusion.  The spleen, adrenal glands, pancreas and kidneys are unremarkable. The liver and, gallbladder and biliary tree appear normal.  There is a large volume of free intraperitoneal air. Scattered abdominal ascites is identified. No focal fluid collection is seen. There appears to marked wall thickening of a loop of small bowel in the left upper quadrant of the abdomen. No pneumatosis or portal venous gas is identified. When the patient also has prominent fluid along the greater curvature of the stomach. The stomach is incompletely distended making evaluation for wall thickening difficult. The duodenum is unremarkable in appearance.  No lymphadenopathy is seen. Aortoiliac atherosclerosis without aneurysm is identified. Small bilateral inguinal hernias are noted.  No focal bony abnormality identified.  IMPRESSION: Findings consistent with perforation of a viscus. The  site of perforation is not definitively identified but most suspicious sites are along the greater curvature of the stomach and a loop of small bowel in the left upper quadrant of the abdomen.  Aortoiliac atherosclerosis without aneurysm.  Calcified pleural plaques consistent with prior asbestos exposure.  Critical Value/emergent results were called by telephone at the time of interpretation on  04/24/2015 at 8:38 pm to Dr. Gladstone PihAVID SCHAEVITZ , who verbally acknowledged these results.   Electronically Signed   By: Drusilla Kannerhomas  Dalessio M.D.   On: 04/24/2015 20:39    Assessment/Plan: Overall he is significantly improved. We will repeat his laboratory values this morning. We will follow his urine output closely as he making borderline amounts the present time.

## 2015-04-25 NOTE — Anesthesia Procedure Notes (Signed)
Procedure Name: Intubation Date/Time: 04/25/2015 11:44 PM Performed by: Waldo LaineJUSTIS, Jaslyne Beeck Pre-anesthesia Checklist: Patient identified, Emergency Drugs available, Suction available, Patient being monitored and Timeout performed Patient Re-evaluated:Patient Re-evaluated prior to inductionOxygen Delivery Method: Circle system utilized Preoxygenation: Pre-oxygenation with 100% oxygen Intubation Type: IV induction Ventilation: Mask ventilation without difficulty Laryngoscope Size: Glidescope Grade View: Grade IV Tube type: Oral Number of attempts: 3 Airway Equipment and Method: Stylet Placement Confirmation: positive ETCO2,  CO2 detector and breath sounds checked- equal and bilateral Secured at: 21 cm Tube secured with: Tape Dental Injury: Teeth and Oropharynx as per pre-operative assessment  Difficulty Due To: Difficult Airway- due to anterior larynx and Difficult Airway- due to reduced neck mobility Future Recommendations: Recommend- induction with short-acting agent, and alternative techniques readily available

## 2015-04-26 LAB — CBC
HEMATOCRIT: 36.2 % — AB (ref 40.0–52.0)
Hemoglobin: 12.4 g/dL — ABNORMAL LOW (ref 13.0–18.0)
MCH: 32.9 pg (ref 26.0–34.0)
MCHC: 34.2 g/dL (ref 32.0–36.0)
MCV: 96.2 fL (ref 80.0–100.0)
PLATELETS: 176 10*3/uL (ref 150–440)
RBC: 3.76 MIL/uL — ABNORMAL LOW (ref 4.40–5.90)
RDW: 14.3 % (ref 11.5–14.5)
WBC: 10 10*3/uL (ref 3.8–10.6)

## 2015-04-26 NOTE — Clinical Social Work Placement (Signed)
   CLINICAL SOCIAL WORK PLACEMENT  NOTE  Date:  04/26/2015  Patient Details  Name: Michael Lowe MRN: [id number] Date of Birth: 10/06/1932  Clinical Social Work is seeking post-discharge placement for this patient at the Skilled  Nursing Facility level of care (*CSW will initial, date and re-position this form in  chart as items are completed):      Patient/family provided with [facility] Clinical Social Work Department's list of facilities offering this level of care within the geographic area requested by the patient (or if unable, by the patient's family).  Yes   Patient/family informed of their freedom to choose among providers that offer the needed level of care, that participate in Medicare, Medicaid or managed care program needed by the patient, have an available bed and are willing to accept the patient.  No (n/a family seeking placement in [place])   Patient/family informed of [facility]'s ownership interest in [facility] and [facility], as well as of the fact that they are under no obligation to receive care at these facilities.  PASRR submitted to EDS on 04/26/15     PASRR number received on 04/26/15     Existing PASRR number confirmed on       FL2 transmitted to all facilities in geographic area requested by pt/family on       FL2 transmitted to all facilities within larger geographic area on 04/26/15     Patient informed that his/her managed care company has contracts with or will negotiate with certain facilities, including the following:            Patient/family informed of bed offers received.  Patient chooses bed at       Physician recommends and patient chooses bed at      Patient to be transferred to   on  .  Patient to be transferred to facility by       Patient family notified on   of transfer.  Name of family member notified:        PHYSICIAN       Additional Comment:     _______________________________________________ [name], Ledell Codrington D, LCSW 04/26/2015, 1:37 PM

## 2015-04-26 NOTE — Evaluation (Signed)
Physical Therapy Evaluation Patient Details Name: Michael Lowe MRN: [id number] DOB: [birth year] Today's Date: 04/26/2015   History of Present Illness  81 yo male with onset of perforation and repair of duodenum and now has possible PNA.  PMHx:  asbestos exposure and aortoiliac atherosclerosis   Clinical Impression  Pt is planned for SNF admission and spoke with case manager and family about the same.  Will see what his tolerance for activity is tomorrow and will plan to upgrade expectations if he is better, but very unsafe to walk at present.    Follow Up Recommendations SNF    Equipment Recommendations  Rolling walker with 5" wheels    Recommendations for Other Services       Precautions / Restrictions Precautions Precautions: Fall Precaution Comments: confusion Restrictions Weight Bearing Restrictions: No      Mobility  Bed Mobility Overal bed mobility: Needs Assistance Bed Mobility: Supine to Sit     Supine to sit: Mod assist     General bed mobility comments: assist to initiate and to slide out to edge of bed, trunk support  Transfers Overall transfer level: Needs assistance Equipment used: Rolling walker (2 wheeled);1 person hand held assist Transfers: Sit to/from [employer] Sit to Stand: Mod assist Stand pivot transfers: Min assist       General transfer comment: reminders 100% for hand placement  Ambulation/Gait Ambulation/Gait assistance: Min assist Ambulation Distance (Feet): 35 Feet Assistive device: Rolling walker (2 wheeled);1 person hand held assist Gait Pattern/deviations: Step-to pattern;Decreased stride length;Shuffle;Wide base of support;Drifts right/left Gait velocity: slow Gait velocity interpretation: Below normal speed for age/gender    Stairs            Wheelchair Mobility    Modified Rankin (Stroke Patients Only)       Balance Overall balance assessment: Needs assistance Sitting-balance support: Feet  supported Sitting balance-Leahy Scale: Fair   Postural control: Posterior lean Standing balance support: Bilateral upper extremity supported Standing balance-Leahy Scale: Poor                               Pertinent Vitals/Pain Pain Assessment: Faces Faces Pain Scale: Hurts little more Pain Location: abdomen Pain Intervention(s): Premedicated before session;Monitored during session    Home Living Family/patient expects to be discharged to:: Private residence Living Arrangements: Spouse/significant other Available Help at Discharge: Family Type of Home: House Home Access: Stairs to enter Entrance Stairs-Rails: Left Entrance Stairs-Number of Steps: 2 Home Layout: One level Home Equipment: None      Prior Function Level of Independence: Independent               Hand Dominance        Extremity/Trunk Assessment   Upper Extremity Assessment: Overall WFL for tasks assessed           Lower Extremity Assessment: Generalized weakness      Cervical / Trunk Assessment: Normal  Communication   Communication: No difficulties;Other (comment) (does have some confusion)  Cognition Arousal/Alertness: Awake/alert Behavior During Therapy: Flat affect Overall Cognitive Status: History of cognitive impairments - at baseline Area of Impairment: Memory;Safety/judgement;Awareness     Memory: Decreased recall of precautions;Decreased short-term memory   Safety/Judgement: Decreased awareness of safety;Decreased awareness of deficits Awareness: Intellectual        General Comments General comments (skin integrity, edema, etc.): Pt is with family in room and is from out of the area, and will need to decide about  SNF placement location.  Pt should be able to transport in the car to go home to [place]    Exercises        Assessment/Plan    PT Assessment Patient needs continued PT services  PT Diagnosis Difficulty walking;Generalized weakness   PT  Problem List Decreased strength;Decreased range of motion;Decreased activity tolerance;Decreased balance;Decreased mobility;Decreased coordination;Decreased cognition;Decreased knowledge of use of DME;Decreased safety awareness;Decreased knowledge of precautions;Cardiopulmonary status limiting activity;Pain;Decreased skin integrity  PT Treatment Interventions DME instruction;Gait training;Stair training;Functional mobility training;Therapeutic activities;Therapeutic exercise;Balance training;Neuromuscular re-education;Cognitive remediation;Patient/family education   PT Goals (Current goals can be found in the Care Plan section) Acute Rehab PT Goals Patient Stated Goal: none stated PT Goal Formulation: With patient/family Time For Goal Achievement: 05/10/15 Potential to Achieve Goals: Good    Frequency Min 2X/week   Barriers to discharge Inaccessible home environment;Decreased caregiver support      Co-evaluation               End of Session Equipment Utilized During Treatment: Gait belt Activity Tolerance: Patient limited by fatigue;Patient limited by pain Patient left: in chair;with call bell/phone within reach;with chair alarm set;with family/visitor present Nurse Communication: Mobility status         Time: [id number] PT Time Calculation (min) (ACUTE ONLY): 40 min   Charges:   PT Evaluation $Initial PT Evaluation Tier I: 1 Procedure PT Treatments $Gait Training: 8-22 mins $Therapeutic Activity: 8-22 mins   PT G Codes:        Ivar DrapeStout, Phelix Fudala E 04/26/2015, 12:01 PM   Samul Dadauth Emmaus Brandi, PT MS Acute Rehab Dept. Number: [facility] [phone] and [facility] [phone]

## 2015-04-26 NOTE — Progress Notes (Signed)
Patient ID: Oswald HillockGussie Resetar, male   DOB: 05/25/1932, 81 y.o.   MRN: [id number]   Postoperative day #2 status post exploratory laparotomy with Cheree DittoGraham patch repair or free to duodenal ulcer. He is feeling better. He is having some incisional pain.  Filed Vitals:   04/25/15 2029 04/26/15 0100 04/26/15 0505 04/26/15 0756  BP: 124/68 115/72 125/63 127/72  Pulse: 105 103 105 105  Temp: 97.7 F (36.5 C) 98 F (36.7 C) 97.5 F (36.4 C) 98.4 F (36.9 C)  TempSrc: Oral Axillary Oral Oral  Resp: 16 16 14    Height:      Weight:      SpO2: 95% 96% 95% 96%    CBC Latest Ref Rng 04/26/2015 04/25/2015 04/24/2015  WBC 3.8 - 10.6 K/uL 10.0 7.0 11.5(H)  Hemoglobin 13.0 - 18.0 g/dL 12.4(L) 13.3 13.9  Hematocrit 40.0 - 52.0 % 36.2(L) 40.4 42.4  Platelets 150 - 440 K/uL 176 208 250   BMP Latest Ref Rng 04/25/2015 04/24/2015  Glucose 65 - 99 mg/dL 409(W213(H) 119(J201(H)  BUN 6 - 20 mg/dL 20 14  Creatinine 4.780.61 - 1.24 mg/dL 2.951.09 6.211.15  Sodium 308135 - 145 mmol/L 136 139  Potassium 3.5 - 5.1 mmol/L 4.8 4.0  Chloride 101 - 111 mmol/L 107 101  CO2 22 - 32 mmol/L 22 27  Calcium 8.9 - 10.3 mg/dL 7.5(L) 9.0   He is alert and oriented. Lungs are clear. Abdomen demonstrates dry incision mildly distended. No drainage is noted.  Impression stable postoperative day #2.  Plan discontinue Foley as urine output has improved. Continue ice chips.   Check CBC and basic metabolic panel in the morning.   Had a long discussion with the patient's wife and daughter today regarding his hospital course how he is progressing nicely.   Plan is for 2 weeks completion of therapy of Helicobacter pylori therapy upon discharge.

## 2015-04-26 NOTE — Care Management (Signed)
Important Message  Patient Details  Name: Michael HillockGussie Lowe MRN: [id number] Date of Birth: [birth year]   Medicare Important Message Given:  Yes-second notification given    [name] 04/26/2015, 11:59 AM

## 2015-04-26 NOTE — Plan of Care (Signed)
Problem: Phase I Progression Outcomes Goal: Other Phase I Outcomes/Goals Outcome: Progressing Patient is hemodynamically stable with daughter and wife at bedside overnight. Patient's c/o of incisional pain is well controlled by PRN pain med. He denied nausea and does not appear to be in any distress. He is NSR/ST on the monitor with VS WDL for patient. Patient has old drainage on his surgical/incision site, and has a hypoactive bowel sound. He rested for most of the night. Will continue to monitor.

## 2015-04-26 NOTE — Clinical Social Work Note (Signed)
Clinical Social Work Assessment  Patient Details  Name: Michael Lowe MRN: [id number] Date of Birth: 09-08-1932  Date of referral:  04/26/15               Reason for consult:  Facility Placement                Permission sought to share information with:  Family Supports Permission granted to share information::  Yes, Verbal Permission Granted  Name::     [name]  Agency::     Relationship::  wife  Contact Information:  [phone]  Housing/Transportation Living arrangements for the past 2 months:  Single Family Home Source of Information:  Patient, Spouse, Adult Children Patient Interpreter Needed:  None Criminal Activity/Legal Involvement Pertinent to Current Situation/Hospitalization:  No - Comment as needed Significant Relationships:  Adult Children, Spouse Lives with:  Self, Spouse Do you feel safe going back to the place where you live?  Yes Need for family participation in patient care:  Yes (Comment)  Care giving concerns:  Pt is weak.   Social Worker assessment / plan:  Pt is 81 y/o married male who currently [place] in [place].  His wife of 55 years and daughter were at bedside.  PT is recommending STR.  Pt is agreeable.  Pt stated he has never been to STR. CSW will initiate a [place] search [place].  Family prefers the following facilities ([facility], [facility] [address] or [facility], [place]  CSW will extend bed offers once received.  Employment status:  Retired Health and safety inspectornsurance information:  Medicare PT Recommendations:  Skilled Nursing Facility Information / Referral to community resources:  Skilled Nursing Facility  Patient/Family's Response to care:  Pt and family agreeable to [facility].    Patient/Family's Understanding of and Emotional Response to Diagnosis, Current Treatment, and Prognosis:  Pt and family were pleasant and appreciative of CSW assistance.  Family prefers a STR close to home and plan to transport pt by car  at discharge.  Pt appeared relieved once CSW answered questions that had about going to STR.    Emotional Assessment Appearance:  Appears stated age Attitude/Demeanor/Rapport:  Other (Attentive) Affect (typically observed):  Accepting, Quiet, Pleasant, Calm Orientation:  Oriented to Self, Oriented to Place, Oriented to  Time, Oriented to Situation Alcohol / Substance use:  Not Applicable Psych involvement (Current and /or in the community):  No (Comment)  Discharge Needs  Concerns to be addressed:  Discharge Planning Concerns Readmission within the last 30 days:  No Current discharge risk:  None Barriers to Discharge:  No Barriers Identified   [name], Grizel Vesely D, LCSW 04/26/2015, 12:00 PM

## 2015-04-27 DIAGNOSIS — R69 Illness, unspecified: Secondary | ICD-10-CM

## 2015-04-27 LAB — CBC
HCT: 35.5 % — ABNORMAL LOW (ref 40.0–52.0)
HEMOGLOBIN: 11.6 g/dL — AB (ref 13.0–18.0)
MCH: 31.1 pg (ref 26.0–34.0)
MCHC: 32.6 g/dL (ref 32.0–36.0)
MCV: 95.7 fL (ref 80.0–100.0)
Platelets: 189 10*3/uL (ref 150–440)
RBC: 3.71 MIL/uL — ABNORMAL LOW (ref 4.40–5.90)
RDW: 14.5 % (ref 11.5–14.5)
WBC: 12.3 10*3/uL — ABNORMAL HIGH (ref 3.8–10.6)

## 2015-04-27 LAB — BASIC METABOLIC PANEL
Anion gap: 7 (ref 5–15)
BUN: 13 mg/dL (ref 6–20)
CO2: 23 mmol/L (ref 22–32)
Calcium: 7.9 mg/dL — ABNORMAL LOW (ref 8.9–10.3)
Chloride: 105 mmol/L (ref 101–111)
Creatinine, Ser: 0.9 mg/dL (ref 0.61–1.24)
GFR calc non Af Amer: >60 mL/min (ref >60)
Glucose, Bld: 119 mg/dL — ABNORMAL HIGH (ref 65–99)
Potassium: 3.6 mmol/L (ref 3.5–5.1)
Sodium: 135 mmol/L (ref 135–145)

## 2015-04-27 MED ORDER — HYDROCODONE-ACETAMINOPHEN 5-325 MG PO TABS
2.0000 | ORAL_TABLET | Freq: Four times a day (QID) | ORAL | Status: DC | PRN
  Administered 2015-04-27 (×2): 2 via ORAL
  Filled 2015-04-27 (×2): qty 2

## 2015-04-27 MED ORDER — KETOROLAC TROMETHAMINE 30 MG/ML IJ SOLN
30.0000 mg | Freq: Three times a day (TID) | INTRAMUSCULAR | Status: AC
  Administered 2015-04-27 – 2015-04-28 (×3): 30 mg via INTRAVENOUS
  Filled 2015-04-27 (×3): qty 1

## 2015-04-27 NOTE — Progress Notes (Signed)
Physical Therapy Treatment Patient Details Name: Michael Lowe MRN: [id number] DOB: 17-Nov-1931 Today's Date: 04/27/2015    History of Present Illness 81 yo male with onset of perforation and repair of duodenum and now has possible PNA.  PMHx:  asbestos exposure and aortoiliac atherosclerosis     PT Comments    Pt is more energetic and safer on his feet today, with some controlled gait and better transfer skills.  Pt is planned for discharge to [facility] to SNF, which will be transporting there with family.  His plan is to continue to see PT until DC from hospital, to work on LE strength and standing balance control for transfers and gait.  Follow Up Recommendations  SNF     Equipment Recommendations  Rolling walker with 5" wheels    Recommendations for Other Services       Precautions / Restrictions Precautions Precautions: Fall Precaution Comments: clearer mentally today Restrictions Weight Bearing Restrictions: No    Mobility  Bed Mobility Overal bed mobility: Needs Assistance Bed Mobility: Sit to Supine       Sit to supine: Mod assist   General bed mobility comments: cued sequence and pt assisted to lean to R and PT helped with LE's  Transfers Overall transfer level: Needs assistance Equipment used: Rolling walker (2 wheeled);1 person hand held assist Transfers: Stand Pivot Transfers;Sit to/from Stand Sit to Stand: Mod assist Stand pivot transfers: Min assist       General transfer comment: cued safety and hand placement  Ambulation/Gait Ambulation/Gait assistance: Min assist;Min guard Ambulation Distance (Feet): 150 Feet Assistive device: Rolling walker (2 wheeled);1 person hand held assist   Gait velocity: slow controlled Gait velocity interpretation: Below normal speed for age/gender General Gait Details: step through with slow progression of turns but remains in walker.  Has better control of steps with longer more even strides   Stairs             Wheelchair Mobility    Modified Rankin (Stroke Patients Only)       Balance Overall balance assessment: Needs assistance   Sitting balance-Leahy Scale: Good       Standing balance-Leahy Scale: Fair Standing balance comment: fair- dynamic                    Cognition Arousal/Alertness: Awake/alert Behavior During Therapy: Flat affect Overall Cognitive Status: History of cognitive impairments - at baseline Area of Impairment: Memory;Following commands;Safety/judgement;Problem solving     Memory: Decreased recall of precautions;Decreased short-term memory Following Commands: Follows one step commands inconsistently Safety/Judgement: Decreased awareness of safety Awareness: Intellectual Problem Solving: Slow processing;Decreased initiation      Exercises      General Comments General comments (skin integrity, edema, etc.): Approval is obtained for pt to get from here to [place] with family, will be able to go to SNF there.      Pertinent Vitals/Pain Pain Assessment: No/denies pain    Home Living                      Prior Function            PT Goals (current goals can now be found in the care plan section) Acute Rehab PT Goals Patient Stated Goal: to walk out in the hall Progress towards PT goals: Progressing toward goals    Frequency  Min 2X/week    PT Plan Current plan remains appropriate    Co-evaluation  End of Session Equipment Utilized During Treatment: Gait belt Activity Tolerance: Patient tolerated treatment well Patient left: in bed;with call bell/phone within reach;with bed alarm set     Time: [id number] PT Time Calculation (min) (ACUTE ONLY): 34 min  Charges:  $Gait Training: 8-22 mins $Therapeutic Activity: 8-22 mins                    G Codes:      Ivar DrapeStout, Tannia Contino E 04/27/2015, 5:21 PM  Samul Dadauth Eeshan Verbrugge, PT MS Acute Rehab Dept. Number: [facility] [id number] and [facility] [phone]

## 2015-04-27 NOTE — Progress Notes (Signed)
Surgery  POD 2 S/P ex lap and graham pathch closure perforated duodenal ulcer  Passing some flatus, less pain, ambulating Voided following foley cath removal.  Filed Vitals:   04/26/15 0756 04/26/15 1615 04/26/15 2253 04/27/15 0753  BP: 127/72 134/76 169/80 155/89  Pulse: 105 88 103 93  Temp: 98.4 F (36.9 C) 97.7 F (36.5 C) 99 F (37.2 C) 99 F (37.2 C)  TempSrc: Oral Oral Oral Oral  Resp: 15 18 18    Height:      Weight:      SpO2: 96% 98% 93% 96%    PE: Awake alert and cooperative. Abdomen less distended incision is clean no drainage dressing was removed.  Labs:  CBC    Component Value Date/Time   WBC 12.3* 04/27/2015 0620   RBC 3.71* 04/27/2015 0620   HGB 11.6* 04/27/2015 0620   HCT 35.5* 04/27/2015 0620   PLT 189 04/27/2015 0620   MCV 95.7 04/27/2015 0620   MCH 31.1 04/27/2015 0620   MCHC 32.6 04/27/2015 0620   RDW 14.5 04/27/2015 0620   LYMPHSABS 1.5 04/24/2015 1634   MONOABS 0.4 04/24/2015 1634   EOSABS 0.0 04/24/2015 1634   BASOSABS 0.0 04/24/2015 1634    BMP Latest Ref Rng 04/27/2015 04/25/2015 04/24/2015  Glucose 65 - 99 mg/dL 478(G119(H) 956(O213(H) 130(Q201(H)  BUN 6 - 20 mg/dL 13 20 14   Creatinine 0.61 - 1.24 mg/dL 6.570.90 8.461.09 9.621.15  Sodium 135 - 145 mmol/L 135 136 139  Potassium 3.5 - 5.1 mmol/L 3.6 4.8 4.0  Chloride 101 - 111 mmol/L 105 107 101  CO2 22 - 32 mmol/L 23 22 27   Calcium 8.9 - 10.3 mg/dL 7.9(L) 7.5(L) 9.0     IMP making good progress.  Plan  discontinue telemetry. Continue physical therapy. Appreciate case management input. Clear liquid diet. Mobilize. Decrease IV fluids. Round of Toradol and start Norco by mouth.

## 2015-04-27 NOTE — Clinical Social Work Note (Signed)
CSW spoke with Kami at [facility] in [place] and she states that they are able to take patient. CSW met with patient's daughter and wife and informed them. CSW confirmed with them that they will transport patient when time. Shela Leff MSW,LCSWA [phone]

## 2015-04-28 ENCOUNTER — Inpatient Hospital Stay: Payer: Medicare Other

## 2015-04-28 NOTE — Progress Notes (Signed)
Spoke with Dr Excell Seltzerooper concerning patient's emesis. No new orders.

## 2015-04-28 NOTE — Clinical Social Work Note (Signed)
Surgeon stated discharge will not happen over weekend. CSW has contacted Kami at [facility] in [place]: [phone] and notified her of this. If patient discharges on Monday, July 4th, Kami stated she will be off but to still call her at the above number and she will help facilitate. Patient's family to transport at discharge.  York SpanielMonica Lamiyah Schlotter MSW,LCSWA [phone]

## 2015-04-28 NOTE — Progress Notes (Signed)
Today he's had more gas. He's had passage of flatus. He's had no further nausea and vomiting. His abdomen is much softer than this morning. It is nontender. Impression slowly making progress. Continue ice chips. I do not think that he would be ready for a Sunday discharge.

## 2015-04-28 NOTE — Progress Notes (Signed)
Surgery  POD 4  S/P for laparotomy with repair of perforated duodenal ulcer and Graham patch.  Objective. The patient had 1 L of emesis last night. He thinks this is related to pain medication. Currently he is continuing to pass some flatus. Pain seems well controlled.  Filed Vitals:   04/27/15 0753 04/27/15 1548 04/27/15 2314 04/28/15 0732  BP: 155/89 135/73 131/84 107/71  Pulse: 93 87 82 89  Temp: 99 F (37.2 C) 98.1 F (36.7 C) 98.6 F (37 C) 97.7 F (36.5 C)  TempSrc: Oral Oral Oral Oral  Resp:  18 18 19   Height:      Weight:      SpO2: 96% 98% 100% 98%  .   CBC Latest Ref Rng 04/27/2015 04/26/2015 04/25/2015  WBC 3.8 - 10.6 K/uL 12.3(H) 10.0 7.0  Hemoglobin 13.0 - 18.0 g/dL 11.6(L) 12.4(L) 13.3  Hematocrit 40.0 - 52.0 % 35.5(L) 36.2(L) 40.4  Platelets 150 - 440 K/uL 189 176 208    BMP Latest Ref Rng 04/27/2015 04/25/2015 04/24/2015  Glucose 65 - 99 mg/dL 409(W119(H) 119(J213(H) 478(G201(H)  BUN 6 - 20 mg/dL 13 20 14   Creatinine 0.61 - 1.24 mg/dL 9.560.90 2.131.09 0.861.15  Sodium 135 - 145 mmol/L 135 136 139  Potassium 3.5 - 5.1 mmol/L 3.6 4.8 4.0  Chloride 101 - 111 mmol/L 105 107 101  CO2 22 - 32 mmol/L 23 22 27   Calcium 8.9 - 10.3 mg/dL 7.9(L) 7.5(L) 9.0     PE: Alert and oriented, nontoxic. Abdomen remained distended. Wound is well-healed. No drainage.   IMP postop gastric ileus. The patient really did not take much by mouth yesterday. 180 cc charted.  Plan dc clears, not really taking at this time. 2 way series Family desires rehab transfer in [place] area upon discharge.

## 2015-04-28 NOTE — Progress Notes (Signed)
Initial Nutrition Assessment  INTERVENTION:  Medical Food Supplement Therapy: will recommend supplement once diet order able to be advanced    NUTRITION DIAGNOSIS:  Inadequate oral intake related to inability to eat as evidenced by NPO status.  GOAL:   (Diet advancement as medically able )  MONITOR:   (Energy Intake, Electrolyte and Renal Profile, Digestive System)  REASON FOR ASSESSMENT:  NPO/Clear Liquid Diet    ASSESSMENT:  Pt admitted with abdominal pain secondary to exp lap [place] patch repair to duodenal ulcer. Pt with 1L emesis overnight per MD note; pt made NPO this am. Per MD note, post-op ileus. Pt ambulating on hallway with PT/CNA this afternoon. PMHx:  Past Medical History  Diagnosis Date  . Hypercholesteremia     Diet Order: NPO (NPO/CL day 4)  Current Nutrition: Pt NPO currently. Pt drinking small amounts of CL yesterday.  Food/Nutrition-Related History: Unable to assess, per MST pt with no decrease in appetite PTA.    Medications: D5 0.45%NS with KCl at 12400mL/hr (providing 408kcals in 24 hours) pepcid  Electrolyte/Renal Profile and Glucose Profile:   Recent Labs Lab 04/24/15 1634 04/25/15 0548 04/27/15 0620  NA 139 136 135  K 4.0 4.8 3.6  CL 101 107 105  CO2 27 22 23   BUN 14 20 13   CREATININE 1.15 1.09 0.90  CALCIUM 9.0 7.5* 7.9*  GLUCOSE 201* 213* 119*   Protein Profile:  Recent Labs Lab 04/24/15 1634  ALBUMIN 4.2    Gastrointestinal Profile: +gas, hypoactive BS, distended abdomen per Nsg documentation Last BM: 6/26   Nutrition-Focused Physical Exam Findings:  Unable to complete Nutrition-Focused physical exam at this time.    Weight Change: Per MST no decrease in weight PTA. No new weight since admission. Anthropometrics:   Height:  Ht Readings from Last 1 Encounters:  04/24/15 6' (1.829 m)    Weight:  Wt Readings from Last 1 Encounters:  04/25/15 154 lb 3.2 oz (69.945 kg)    Wt Readings from Last 10 Encounters:   04/25/15 154 lb 3.2 oz (69.945 kg)    BMI:  Body mass index is 20.91 kg/(m^2).  Estimated Nutritional Needs:  Kcal:  1856-2139kcals, BEE: 1406kcals, TEE: (IF 1.1-1.3)(AF 1.2)   Protein:  70-84g protein (1.0-1.2g/kg)  Fluid:  1750-216700mL of fluid (25-5930mL/kg)  Skin:  Reviewed, no issues  Diet Order:   NPO  EDUCATION NEEDS:  No education needs identified at this time   MODERATE Care Level  Michael Lowe, RD, LDN Pager [phone]

## 2015-04-28 NOTE — Care Management (Signed)
Important Message  Patient Details  Name: Michael HillockGussie Lowe MRN: [id number] Date of Birth: [birth year]   Medicare Important Message Given:  Yes-third notification given    [name] 04/28/2015, 11:53 AM

## 2015-04-29 ENCOUNTER — Inpatient Hospital Stay: Payer: Medicare Other

## 2015-04-29 DIAGNOSIS — R198 Other specified symptoms and signs involving the digestive system and abdomen: Secondary | ICD-10-CM | POA: Insufficient documentation

## 2015-04-29 LAB — C DIFFICILE QUICK SCREEN W PCR REFLEX
C Diff antigen: NEGATIVE
C Diff interpretation: NEGATIVE
C Diff toxin: NEGATIVE

## 2015-04-29 LAB — CBC
HEMATOCRIT: 27 % — AB (ref 40.0–52.0)
HEMOGLOBIN: 8.8 g/dL — AB (ref 13.0–18.0)
MCH: 31.2 pg (ref 26.0–34.0)
MCHC: 32.6 g/dL (ref 32.0–36.0)
MCV: 95.9 fL (ref 80.0–100.0)
PLATELETS: 215 10*3/uL (ref 150–440)
RBC: 2.81 MIL/uL — ABNORMAL LOW (ref 4.40–5.90)
RDW: 14.5 % (ref 11.5–14.5)
WBC: 17.9 10*3/uL — AB (ref 3.8–10.6)

## 2015-04-29 MED ORDER — HYDROCODONE-ACETAMINOPHEN 5-325 MG PO TABS: 1.0000 | ORAL_TABLET | Freq: Four times a day (QID) | ORAL | Status: DC | PRN

## 2015-04-29 MED ORDER — SODIUM CHLORIDE 0.9 % IV SOLN
12.5000 mg | Freq: Four times a day (QID) | INTRAVENOUS | Status: DC | PRN
  Administered 2015-04-29 – 2015-05-03 (×5): 12.5 mg via INTRAVENOUS
  Filled 2015-04-29 (×12): qty 0.5

## 2015-04-29 NOTE — Progress Notes (Signed)
Surgery  POD 5  S/P exploratory laparotomy [place] patch closure perforated duodenal ulcer.  The patient is doing well. He's had several bowel movements. He's had 36 hours of no nausea or vomiting. His abdomen feels less distended to him. He is improving. He states that he feels much better.  Main complaint right now is that of hiccups.  Filed Vitals:   04/28/15 0732 04/28/15 1532 04/28/15 2322 04/29/15 0802  BP: 107/71 117/59 134/67 139/60  Pulse: 89 100 105 87  Temp: 97.7 F (36.5 C) 97.1 F (36.2 C) 98.6 F (37 C) 98 F (36.7 C)  TempSrc: Oral Axillary Oral Oral  Resp: 19  18 16   Height:      Weight:      SpO2: 98% 97% 96% 96%    PE: Affect is normal alert and oriented. Lungs are clear. Abdomen is much less distended wound is clean and intact with no drainage. His abdomen is nontender.  Labs CBC is pending to follow up the small increase seen on the 30th.  IMP stable postoperative day #5 with resolved ileus. Hiccups seemed to be the main problem.  Plan trial of Thorazine. Noncarbonated clear liquids. Tentative discharge plan is for Monday.   Discussed in detail with the patient's wife and daughter at the bedside.

## 2015-04-30 ENCOUNTER — Inpatient Hospital Stay: Payer: Medicare Other

## 2015-04-30 LAB — CBC WITH DIFFERENTIAL/PLATELET
Band Neutrophils: 11 % — ABNORMAL HIGH (ref 0–10)
Basophils Absolute: 0 10*3/uL (ref 0.0–0.1)
Basophils Relative: 0 % (ref 0–1)
Blasts: 0 %
EOS ABS: 0 10*3/uL (ref 0.0–0.7)
Eosinophils Relative: 0 % (ref 0–5)
HCT: 15.7 % — ABNORMAL LOW (ref 40.0–52.0)
HEMOGLOBIN: 5.1 g/dL — AB (ref 13.0–18.0)
LYMPHS PCT: 12 % (ref 12–46)
Lymphs Abs: 2.1 10*3/uL (ref 0.7–4.0)
MCH: 31.2 pg (ref 26.0–34.0)
MCHC: 32.4 g/dL (ref 32.0–36.0)
MCV: 96.3 fL (ref 80.0–100.0)
METAMYELOCYTES PCT: 1 %
Monocytes Absolute: 1 10*3/uL (ref 0.1–1.0)
Monocytes Relative: 6 % (ref 3–12)
Myelocytes: 0 %
NEUTROS PCT: 70 % (ref 43–77)
Neutro Abs: 14.1 10*3/uL — ABNORMAL HIGH (ref 1.7–7.7)
OTHER: 0 %
PLATELETS: 174 10*3/uL (ref 150–440)
Promyelocytes Absolute: 0 %
RBC: 1.63 MIL/uL — AB (ref 4.40–5.90)
RDW: 14.3 % (ref 11.5–14.5)
WBC: 17.2 10*3/uL — AB (ref 3.8–10.6)
nRBC: 0 /100 WBC

## 2015-04-30 LAB — CBC
HCT: 19.1 % — ABNORMAL LOW (ref 40.0–52.0)
HEMATOCRIT: 20.8 % — AB (ref 40.0–52.0)
HEMOGLOBIN: 6.2 g/dL — AB (ref 13.0–18.0)
Hemoglobin: 6.8 g/dL — ABNORMAL LOW (ref 13.0–18.0)
MCH: 31.2 pg (ref 26.0–34.0)
MCH: 30.9 pg (ref 26.0–34.0)
MCHC: 32.7 g/dL (ref 32.0–36.0)
MCHC: 32.3 g/dL (ref 32.0–36.0)
MCV: 95.4 fL (ref 80.0–100.0)
MCV: 95.6 fL (ref 80.0–100.0)
PLATELETS: 200 10*3/uL (ref 150–440)
Platelets: 191 10*3/uL (ref 150–440)
RBC: 2.18 MIL/uL — AB (ref 4.40–5.90)
RBC: 2 MIL/uL — ABNORMAL LOW (ref 4.40–5.90)
RDW: 14.1 % (ref 11.5–14.5)
RDW: 14 % (ref 11.5–14.5)
WBC: 15.8 10*3/uL — AB (ref 3.8–10.6)
WBC: 18.5 10*3/uL — ABNORMAL HIGH (ref 3.8–10.6)

## 2015-04-30 LAB — PREPARE RBC (CROSSMATCH)

## 2015-04-30 LAB — PROTIME-INR
INR: 1.75
PROTHROMBIN TIME: 20.6 s — AB (ref 11.4–15.0)

## 2015-04-30 LAB — APTT: aPTT: 36 seconds (ref 24–36)

## 2015-04-30 LAB — MRSA PCR SCREENING: MRSA by PCR: NEGATIVE

## 2015-04-30 LAB — GLUCOSE, CAPILLARY: Glucose-Capillary: 139 mg/dL — ABNORMAL HIGH (ref 65–99)

## 2015-04-30 LAB — ABO/RH: ABO/RH(D): B POS

## 2015-04-30 MED ORDER — SODIUM CHLORIDE 0.9 % IV BOLUS (SEPSIS)
1000.0000 mL | Freq: Once | INTRAVENOUS | Status: AC
  Administered 2015-04-30: 1000 mL via INTRAVENOUS

## 2015-04-30 MED ORDER — SODIUM CHLORIDE 0.9 % IV SOLN
80.0000 mg | Freq: Once | INTRAVENOUS | Status: AC
  Administered 2015-04-30: 80 mg via INTRAVENOUS
  Filled 2015-04-30: qty 80

## 2015-04-30 MED ORDER — DEXTROSE 5 % AND 0.9 % NACL IV BOLUS: 1000.0000 mL | Freq: Once | INTRAVENOUS | Status: DC

## 2015-04-30 MED ORDER — SODIUM CHLORIDE 0.9 % IV SOLN
Freq: Once | INTRAVENOUS | Status: AC
  Administered 2015-04-30: 13:00:00 via INTRAVENOUS

## 2015-04-30 MED ORDER — SODIUM CHLORIDE 0.9 % IV SOLN
Freq: Once | INTRAVENOUS | Status: AC
  Administered 2015-04-30: 11:00:00 via INTRAVENOUS

## 2015-04-30 MED ORDER — SODIUM CHLORIDE 0.9 % IV SOLN
8.0000 mg/h | INTRAVENOUS | Status: DC
  Administered 2015-04-30 – 2015-05-02 (×6): 8 mg/h via INTRAVENOUS
  Filled 2015-04-30 (×9): qty 80

## 2015-04-30 MED ORDER — SODIUM CHLORIDE 0.9 % IV SOLN
Freq: Once | INTRAVENOUS | Status: AC
  Administered 2015-04-30: 12:00:00 via INTRAVENOUS

## 2015-04-30 MED ORDER — SODIUM CHLORIDE 0.9 % IV SOLN
INTRAVENOUS | Status: DC
  Administered 2015-04-30 – 2015-05-01 (×2): via INTRAVENOUS
  Administered 2015-05-01: 125 mL/h via INTRAVENOUS
  Administered 2015-05-02 (×2): via INTRAVENOUS

## 2015-04-30 MED ORDER — DEXTROSE-NACL 5-0.9 % IV SOLN: INTRAVENOUS | Status: DC

## 2015-04-30 MED ORDER — PANTOPRAZOLE SODIUM 40 MG IV SOLR
40.0000 mg | Freq: Two times a day (BID) | INTRAVENOUS | Status: DC
  Administered 2015-05-03 – 2015-05-06 (×6): 40 mg via INTRAVENOUS
  Filled 2015-04-30 (×6): qty 40

## 2015-04-30 NOTE — Consult Note (Addendum)
GI Inpatient Consult Note [name] MD  Reason for Consult: Melena and emesis.   Attending Requesting Consult: Loura Back  Outpatient Primary Physician: Out of town physician  History of Present Illness: Michael Lowe is a 81 y.o. male was from [place]. At times appears to be a little confused area [place] and was doing on 04/24/2015 when he had developed abdominal pain. Into the emergency room here and was admitted a finding of a perforated viscus. Underwent a surgery on 6/ 28,2016. Per the op note there was a perforation noted above the pylorus on the lesser curvature which was closed with a gram patch. His recovery this morning was essentially uncomplicated. This Morning before 9:30 he had several bowel movements and episode of emesis. This was initially black/coffee ground type material also later showed some clots and red blood. He was transferred to the intensive care unit. He has been mildly tachycardic not hypotensive. It is currently at 12:00 noon. He has not had any repeat emesis or black stools. He has been changed from IV twice a day PPI to a IV drip he denies currently any nausea or abdominal pain. He has been having recurrent hiccups past couple of days.  He denies the use of any aspirin or NSAID products at home, however he lists outpatient enteric-coated 81 mg aspirin as well as Mobic 7.5 mg daily as outpatient medications. He states that about 4 years ago he was told he may have an ulcer as that he did not undergo EGD for this.  Past Medical History:  Past Medical History  Diagnosis Date  . Hypercholesteremia     Problem List: Patient Active Problem List   Diagnosis Date Noted  . Perforated viscus   . Intestinal perforation 04/24/2015    Past Surgical History: Past Surgical History  Procedure Laterality Date  . Rotator cuff repair    . Repair of perforated ulcer  04/24/2015    Procedure: REPAIR OF PERFORATED ULCER;  Surgeon:  Tiney Rouge III, MD;  Location: [facility] ORS;  Service: General;;  duodenal    Allergies: No Known Allergies  Home Medications: Prescriptions prior to admission  Medication Sig Dispense Refill Last Dose  . aspirin EC 81 MG tablet Take 81 mg by mouth daily.   Unknown at Unknown time  . fluticasone (FLONASE) 50 MCG/ACT nasal spray Place 1 spray into both nostrils daily.  5 Unknown at Unknown time  . meloxicam (MOBIC) 7.5 MG tablet Take 1 tablet by mouth daily.  0 Unknown at Unknown time  . simvastatin (ZOCOR) 40 MG tablet Take 1 tablet by mouth at bedtime.  3 Unknown at Unknown time   Home medication reconciliation was completed with the patient.   Scheduled Inpatient Medications:   . sodium chloride   Intravenous Once  . sodium chloride   Intravenous Once  . dextrose 5 % and 0.9% NaCl  1,000 mL Intravenous Once  . ertapenem  1 g Intravenous Q24H  . [START ON 05/03/2015] pantoprazole (PROTONIX) IV  40 mg Intravenous Q12H  . sodium chloride  1,000 mL Intravenous Once    Continuous Inpatient Infusions:   . dextrose 5 % and 0.45 % NaCl with KCl 20 mEq/L 75 mL/hr at 04/30/15 0949  . dextrose 5 % and 0.9% NaCl    . pantoprozole (PROTONIX) infusion 8 mg/hr (04/30/15 0956)    PRN Inpatient Medications:  acetaminophen, chlorproMAZINE (THORAZINE) IV, HYDROmorphone (DILAUDID) injection, ondansetron **OR** ondansetron (ZOFRAN) IV  Family History: family  history is not on file.   GI Family History:   Social History:   reports that he has never smoked. He does not have any smokeless tobacco history on file. He reports that he drinks alcohol. The patient denies ETOH, tobacco, or drug use.   ROS  Review of Systems: per admission history and physical  Physical Examination: BP 127/61 mmHg  Pulse 99  Temp(Src) 97.9 F (36.6 C) (Oral)  Resp 21  Ht 6' (1.829 m)  Wt 69.945 kg (154 lb 3.2 oz)  BMI 20.91 kg/m2  SpO2 32100% Gen: 81 year old male no acute distress denies abdominal pain HEENT:  Normocephalic atraumatic eyes are anicteric Neck: No JVD Chest: Clear to auscultation CV: Regular rate and rhythm without rub or gallop Abd: Soft. Minimal to no distention. Nontender, Bowel sounds are positive. There is no rebound to light palpation.midline incision, dry, multiple staples in place. Ext: No clubbing cyanosis or edema Skin: Dry and warm Other:  Data: Lab Results  Component Value Date   WBC 17.2* 04/30/2015   HGB 5.1* 04/30/2015   HCT 15.7* 04/30/2015   MCV 96.3 04/30/2015   PLT 174 04/30/2015    Recent Labs Lab 04/30/15 0524 04/30/15 0832 04/30/15 1014  HGB 6.8* 6.2* 5.1*   Lab Results  Component Value Date   NA 135 04/27/2015   K 3.6 04/27/2015   CL 105 04/27/2015   CO2 23 04/27/2015   BUN 13 04/27/2015   CREATININE 0.90 04/27/2015   Lab Results  Component Value Date   ALT 17 04/24/2015   AST 33 04/24/2015   ALKPHOS 44 04/24/2015   BILITOT 1.0 04/24/2015    Recent Labs Lab 04/30/15 1014  APTT 36  INR 1.75   CBC Latest Ref Rng 04/30/2015 04/30/2015 04/30/2015  WBC 3.8 - 10.6 K/uL 17.2(H) 18.5(H) 15.8(H)  Hemoglobin 13.0 - 18.0 g/dL 5.1(L) 6.2(L) 6.8(L)  Hematocrit 40.0 - 52.0 % 15.7(L) 19.1(L) 20.8(L)  Platelets 150 - 440 K/uL 174 200 191    STUDIES: Dg Chest 2 View  04/29/2015   CLINICAL DATA:  Worsening hypoxia  EXAM: CHEST  2 VIEW  COMPARISON:  04/24/2015.  Abdominal radiography yesterday.  FINDINGS: Small amount of pneumoperitoneum again seen postoperatively as expected.  Heart size is normal. Mediastinal shadows are normal. There is chronic pleural density with calcification likely related to previous asbestos exposure. There is mild volume loss at both lung bases, with associated small effusions. This is worsened compared to the previous study.  IMPRESSION: Worsening of small effusions and basilar atelectasis.  Pulmonary and pleural changes of chronic asbestos exposure.  Intraperitoneal air postoperatively.   Electronically Signed   By: Paulina FusiMark   Shogry M.D.   On: 04/29/2015 18:12   @IMAGES @  Assessment: 1. Hematemesis and melena status post surgery for perforated viscus. Patient is currently hemodynamically stable though mildly tachycardic. There is no abdominal pain. He has not had a recurrent episode of either melena or hematemesis for 2 and a half hours.  Currently on a ppi drip.    Recommendations:1) continue current, serial hgb, transfuse as needed .  With recent surgery he would be high risk for endoscopic procedure.  I would also recommend vascular surgery opinion re microembolization. Discussed with Dr Colette RibasByrd.  Will follow with you.   Thank you for the consult. Please call with questions or concerns.  Christena DeemSKULSKIE, Joshalyn Ancheta U, MD  04/30/2015 11:37 AM     Addendum: INR noted elevated, agree with FFP per discussion with Dr Colette RibasByrd

## 2015-04-30 NOTE — Progress Notes (Signed)
Dr. Egbert GaribaldiBird present and stated "the xray looks good, you can use the central line."

## 2015-04-30 NOTE — Progress Notes (Signed)
Dr. Egbert GaribaldiBird present and gave order for NS bolus to be given over 2 hours and MD to place central line.

## 2015-04-30 NOTE — Progress Notes (Signed)
   04/30/15 1200  Clinical Encounter Type  Visited With Patient  Visit Type Initial  Referral From Nurse  Spiritual Encounters  Spiritual Needs Emotional;Prayer  Stress Factors  Patient Stress Factors Major life changes  Family Stress Factors Major life changes  Chaplain Taylee Gunnells engaged family and prayed with family. Offered emotional and spiritual support.  Family expressed thanks for prayer.

## 2015-04-30 NOTE — Progress Notes (Signed)
Dr. Excell Seltzerooper notified of pt's Hg dropping from 8.8 yesterday to 6.8 this AM.  Pt asymptomatic.  Having frequent loose black stools. MD stated allow doctors to follow up this AM.  Will cont to monitor pt.

## 2015-04-30 NOTE — Progress Notes (Signed)
Report given to Madelaine BhatAdam, RN who is now taking over patient's care. Patient is alert with no distress noted. VSS. Adam, RN aware that blood should be completed by 1440 and that patient needs to receive 1 more unit of PRBC and 2 of FFP. RN notified Dr. Ricki RodriguezSkulski that patient just had a large maroon stool with clots.  Pilot Prindle B

## 2015-04-30 NOTE — Op Note (Signed)
04/24/2015 - 04/25/2015  11:05 AM  PATIENT:  Oswald HillockGussie Sparacino  81 y.o. male  PRE-OPERATIVE DIAGNOSIS:  perforated bowel  POST-OPERATIVE DIAGNOSIS:  perforated duodenal ulcer  PROCEDURE:  Left subclavian triple lumen insertion  SURGEON:  Surgeon(s) and Role:    Raynald KempMark A Emine Lopata, MD   ANESTHESIA: local  SPECIMEN: N/A  EBL: minimal  Description of procedure:    With informed consent and timeout observed the patient was positioned in Trendelenburg. Neck was turned to the right. Left upper chest and neck was sterilely prepped and draped core prep solution.  On first pass needle was placed directly into the subclavian vein utilizing a landmark technique. Venous blood was aspirated. Wire was passed. No ectopy was noted. Needle was removed. Skin incision was fashion. A 8 [place] protect his dilatation was then performed without difficult. The catheter had been previously flushed on the back table was inserted and secured over the wire with was then removed at 18 cm. All 3 ports flushed and aspirated without difficulty. A chest x-ray was obtained. The pneumothorax was noted.

## 2015-04-30 NOTE — Significant Event (Signed)
Rapid response called. Full RRT present. Reported patient had been up to bsc and "had seizure like activity" x 2 and was assisted back to bed. No respiratory difficulty noted, patient initially lethargic then was verbally responsive. Josh RN contacted MD. Order given to transfer patient to ICU per RN. Noted bsc had large amnt of maroon, lumpy stool.

## 2015-04-30 NOTE — Progress Notes (Signed)
Pt reported large amount of emesis. RN observed large amount of dark brown emesis mixed with bright red blood clots. VSS remained stable. MD called, made NPO and put in NGT with 350cc of immediate drainage. After NG was placed pt had to use the bathroom. Placed on BSC and pt became unresponsive and started contracting appearing to have some type of seizure activity. Immediately placed pt back in bed, opened IVF and elevated HOB. Large amount of bright red blood noted in commode after transfer to bed.RR called.  VSS throughout and pt eventually became more oriented. STAT CBC ordered and transfered pt to CCU.  Report given to Belle MeadBritney, RN at the bedside.

## 2015-04-30 NOTE — Progress Notes (Signed)
[facility]   PATIENT NAME: Michael HillockGussie Lowe    MR#:  [id number]  DATE OF BIRTH:  Dec 22, 1931  SUBJECTIVE:  Patient had a large amount of hematemesis this morning which was projectile. He then had a large melanotic stool at same time. He was transferred to the intensive care unit. Rapid response was initiated. The patient now is the intensive care unit somnolent but arousable. There has been no more emesis and a nasogastric tube was successfully placed.  REVIEW OF SYSTEMS:   Review of Systems  Respiratory: Negative for cough and hemoptysis.   Cardiovascular: Negative for chest pain.  Gastrointestinal: Positive for vomiting, diarrhea, blood in stool and melena. Negative for abdominal pain.    DRUG ALLERGIES:  No Known Allergies  VITALS:  Blood pressure 103/59, pulse 102, temperature 98.9 F (37.2 C), temperature source Oral, resp. rate 18, height 6' (1.829 m), weight 69.945 kg (154 lb 3.2 oz), SpO2 98 %.   Filed Vitals:   04/30/15 0742 04/30/15 0836 04/30/15 0947 04/30/15 1000  BP: 113/58 94/49 113/60 103/59  Pulse: 104 108  102  Temp: 98.3 F (36.8 C) 98.9 F (37.2 C)    TempSrc: Oral Oral    Resp: 18  20 18   Height:      Weight:      SpO2: 94% 97% 95% 98%    PHYSICAL EXAMINATION:  GENERAL:  81 y.o.-year-old patient lying in the bed with no acute distress. Awake and cooperative. EYES: Pupils equal, round, reactive to light and accommodation. No scleral icterus. Extraocular muscles intact.  HEENT: Head atraumatic, normocephalic. Oropharynx and nasopharynx clear.  NECK:  Supple, no jugular venous distention. No thyroid enlargement, no tenderness.  LUNGS: Normal breath sounds bilaterally, no wheezing, rales,rhonchi or crepitation. No use of accessory muscles of respiration.  CARDIOVASCULAR: S1, S2 normal. No murmurs, rubs, or gallops.  ABDOMEN: Soft, nontender, nondistended. Bowel sounds present. No organomegaly or mass. Incision is healing nicely with no  drainage. Nasogastric tube is in place draining bilious fluid. EXTREMITIES: No pedal edema, cyanosis, or clubbing.  NEUROLOGIC: Cranial nerves II through XII are intact. Muscle strength 5/5 in all extremities. Sensation intact. Gait not checked.  PSYCHIATRIC: The patient is alert and oriented x 3.  SKIN: No obvious rash, lesion, or ulcer. Abdominal ncision as described above.    ASSESSMENT AND PLAN:   Postoperative upper GI bleed secondary to pyloric channel ulcer with recent perforation and [place] patch closure 6 days ago.   Symptomatic anemia.   Relative hypotension and mild tachycardia.  Plan is for transfer to the intensive care unit, central line and Foley catheter insertion. We will check a pro-time and PTT and INR. The patient will be set up for a total of 2 units of blood transfusion was 2 on hold.  I have asked GI medicine see the patient however in review of the operative note endoscopy may not be feasible.  If he continues to bleed consideration will also be given to interventional and embolization. Certainly if he continues to bleed despite blood transfusion or becomes hypotensive you require antrectomy and Billroth II. Given that he is postoperative day #6 today that would be a very difficult operation.  I will discuss the patient's wife and daughter in detail and they arrive.  Left message with wife [name]. [phone] Home phone [phone]

## 2015-04-30 NOTE — Progress Notes (Signed)
Care discussed with Dr. Filbert SchilderSkalsky as well as RN on duty. Urine output has been acceptable.  There was one bowel movement with clots. Has been no hematemesis. Nasogastric tube output has been minimal. Filed Vitals:   04/30/15 1700 04/30/15 1714 04/30/15 1729 04/30/15 1800  BP: 136/65 123/62 136/65 118/63  Pulse: 107 110 99 100  Temp:  98.2 F (36.8 C) 98.3 F (36.8 C)   TempSrc:      Resp: 19 18 13 18   Height:      Weight:      SpO2: 100% 94% 100% 100%   CBC Latest Ref Rng 04/30/2015 04/30/2015 04/30/2015  WBC 3.8 - 10.6 K/uL 17.2(H) 18.5(H) 15.8(H)  Hemoglobin 13.0 - 18.0 g/dL 5.1(L) 6.2(L) 6.8(L)  Hematocrit 40.0 - 52.0 % 15.7(L) 19.1(L) 20.8(L)  Platelets 150 - 440 K/uL 174 200 191   CMP Latest Ref Rng 04/27/2015 04/25/2015 04/24/2015  Glucose 65 - 99 mg/dL 161(W119(H) 960(A213(H) 540(J201(H)  BUN 6 - 20 mg/dL 13 20 14   Creatinine 0.61 - 1.24 mg/dL 8.110.90 9.141.09 7.821.15  Sodium 135 - 145 mmol/L 135 136 139  Potassium 3.5 - 5.1 mmol/L 3.6 4.8 4.0  Chloride 101 - 111 mmol/L 105 107 101  CO2 22 - 32 mmol/L 23 22 27   Calcium 8.9 - 10.3 mg/dL 7.9(L) 7.5(L) 9.0  Total Protein 6.5 - 8.1 g/dL - - 8.2(H)  Total Bilirubin 0.3 - 1.2 mg/dL - - 1.0  Alkaline Phos 38 - 126 U/L - - 44  AST 15 - 41 U/L - - 33  ALT 17 - 63 U/L - - 17     The patient is awake and alert. Cooperative. Complaining of pain related to the Foley. The abdomen is soft and nontender. Incision is healing nicely with no drainage evident. Staples are intact.  Impression significant anemia secondary to upper GI bleed, currently he is hemodynamically and has received a total of 2 units of blood and 2 units of FFP. We'll check a CBC and repeat ProTime following this and transfuse accordingly. At present I see no indication for surgical intervention or endoscopic evaluation.

## 2015-04-30 NOTE — Progress Notes (Signed)
Paged for Rapid Response. On arrival to room, Josh RN stated it had been cancelled and the MD had been notified. Patient awake, sitting in high fowlers position. Coffee ground emesis noted on gown, sheets. Vital sign check was done by nurse prior to RR team departing. Patient did not appear in acute distress.

## 2015-04-30 NOTE — Progress Notes (Signed)
Dr. Egbert GaribaldiBird present and gave orders for blood, PT and to insert foley.

## 2015-05-01 LAB — CBC
HCT: 21.5 % — ABNORMAL LOW (ref 40.0–52.0)
HCT: 20 % — ABNORMAL LOW (ref 40.0–52.0)
HEMOGLOBIN: 6.9 g/dL — AB (ref 13.0–18.0)
HEMOGLOBIN: 7.2 g/dL — AB (ref 13.0–18.0)
MCH: 31 pg (ref 26.0–34.0)
MCH: 32 pg (ref 26.0–34.0)
MCHC: 33.2 g/dL (ref 32.0–36.0)
MCHC: 34.3 g/dL (ref 32.0–36.0)
MCV: 93.3 fL (ref 80.0–100.0)
MCV: 93.3 fL (ref 80.0–100.0)
PLATELETS: 171 10*3/uL (ref 150–440)
PLATELETS: 154 10*3/uL (ref 150–440)
RBC: 2.31 MIL/uL — ABNORMAL LOW (ref 4.40–5.90)
RBC: 2.15 MIL/uL — ABNORMAL LOW (ref 4.40–5.90)
RDW: 14.1 % (ref 11.5–14.5)
RDW: 14.5 % (ref 11.5–14.5)
WBC: 16.8 10*3/uL — ABNORMAL HIGH (ref 3.8–10.6)
WBC: 15.7 10*3/uL — ABNORMAL HIGH (ref 3.8–10.6)

## 2015-05-01 LAB — PROTIME-INR
INR: 1.27
INR: 1.29
Prothrombin Time: 16.1 seconds — ABNORMAL HIGH (ref 11.4–15.0)
Prothrombin Time: 16.3 seconds — ABNORMAL HIGH (ref 11.4–15.0)

## 2015-05-01 LAB — BASIC METABOLIC PANEL
ANION GAP: 4 — AB (ref 5–15)
Anion gap: 6 (ref 5–15)
BUN: 13 mg/dL (ref 6–20)
BUN: 15 mg/dL (ref 6–20)
CHLORIDE: 109 mmol/L (ref 101–111)
CO2: 28 mmol/L (ref 22–32)
CO2: 28 mmol/L (ref 22–32)
Calcium: 7.4 mg/dL — ABNORMAL LOW (ref 8.9–10.3)
Calcium: 7.6 mg/dL — ABNORMAL LOW (ref 8.9–10.3)
Chloride: 109 mmol/L (ref 101–111)
Creatinine, Ser: 0.85 mg/dL (ref 0.61–1.24)
Creatinine, Ser: 0.87 mg/dL (ref 0.61–1.24)
GFR calc Af Amer: >60 mL/min (ref >60)
GFR calc non Af Amer: >60 mL/min (ref >60)
GFR calc non Af Amer: >60 mL/min (ref >60)
GLUCOSE: 92 mg/dL (ref 65–99)
Glucose, Bld: 96 mg/dL (ref 65–99)
Potassium: 3.6 mmol/L (ref 3.5–5.1)
Potassium: 3.7 mmol/L (ref 3.5–5.1)
Sodium: 141 mmol/L (ref 135–145)
Sodium: 143 mmol/L (ref 135–145)

## 2015-05-01 LAB — PREPARE FRESH FROZEN PLASMA
UNIT DIVISION: 0
Unit division: 0

## 2015-05-01 LAB — HEMOGLOBIN AND HEMATOCRIT, BLOOD
HEMATOCRIT: 28.8 % — AB (ref 40.0–52.0)
Hemoglobin: 9.9 g/dL — ABNORMAL LOW (ref 13.0–18.0)

## 2015-05-01 LAB — PREPARE RBC (CROSSMATCH)

## 2015-05-01 LAB — APTT: APTT: 34 s (ref 24–36)

## 2015-05-01 MED ORDER — SODIUM CHLORIDE 0.9 % IV SOLN
Freq: Once | INTRAVENOUS | Status: AC
  Administered 2015-05-01: 12:00:00 via INTRAVENOUS

## 2015-05-01 MED ORDER — SODIUM CHLORIDE 0.9 % IV SOLN
Freq: Once | INTRAVENOUS | Status: AC
  Administered 2015-05-01: 09:00:00 via INTRAVENOUS

## 2015-05-01 NOTE — Progress Notes (Signed)
A&Ox4. Denies pain. 2 units of PRBC given today and patient tolerated well. VSS. NSR-STach per cardiac monitor. Remains NPO with NG tube to LIS with 200cc output this shift. Wife and family visited throughout the day.  Report given to Hiral and Pam, RN's.

## 2015-05-01 NOTE — Progress Notes (Signed)
7 Days Post-Op   Subjective:  He remains clinically stable. He's had no further upper GI bleeding over the last 12 hours and he has bilious material in his nasogastric tube. He had 2 small melanotic stools last evening. His hemoglobin remained relatively stable at 6.8. He is hemodynamically stable. His urine is clearing and appears to be better hydrated. He has some moderate abdominal pain. He continues on PPI drip.  Vital signs in last 24 hours: Temp:  [97.6 F (36.4 C)-99.5 F (37.5 C)] 98.2 F (36.8 C) (07/04 0922) Pulse Rate:  [89-115] 93 (07/04 1000) Resp:  [13-23] 15 (07/04 1000) BP: (113-141)/(54-71) 132/67 mmHg (07/04 1000) SpO2:  [94 %-100 %] 99 % (07/04 1000) Last BM Date: 05/01/15  Intake/Output from previous day: 07/03 0701 - 07/04 0700 In: 3297 [P.O.:60; I.V.:1770; Blood:772; IV Piggyback:675] Out: 1925 [Urine:1375; Emesis/NG output:300; Stool:250]  GI: soft, non-tender; bowel sounds normal; no masses,  no organomegaly. His wound looks good with no wound drainage or evidence for infection.  Lab Results:  CBC  Recent Labs  05/01/15 0016 05/01/15 0518  WBC 16.8* 15.7*  HGB 7.2* 6.9*  HCT 21.5* 20.0*  PLT 154 171   CMP     Component Value Date/Time   NA 143 05/01/2015 0518   K 3.6 05/01/2015 0518   CL 109 05/01/2015 0518   CO2 28 05/01/2015 0518   GLUCOSE 92 05/01/2015 0518   BUN 13 05/01/2015 0518   CREATININE 0.85 05/01/2015 0518   CALCIUM 7.6* 05/01/2015 0518   PROT 8.2* 04/24/2015 1634   ALBUMIN 4.2 04/24/2015 1634   AST 33 04/24/2015 1634   ALT 17 04/24/2015 1634   ALKPHOS 44 04/24/2015 1634   BILITOT 1.0 04/24/2015 1634   GFRNONAA >60 05/01/2015 0518   GFRAA >60 05/01/2015 0518   PT/INR  Recent Labs  04/30/15 1014 05/01/15 0016  LABPROT 20.6* 16.1*  INR 1.75 1.27    Studies/Results: Dg Chest 1 View  04/30/2015   CLINICAL DATA:  Central line clotted  EXAM: CHEST  1 VIEW  COMPARISON:  Portable exam 1139 hr compared to 04/29/2015   FINDINGS: LEFT subclavian central venous catheter with tip projecting over SVC.  Nasogastric tube extends into stomach.  Normal heart size, mediastinal contours and pulmonary vascularity.  BILATERAL calcified pleural plaques question prior asbestos exposure.  Mild blunting of RIGHT costophrenic angle unchanged.  Subsegmental atelectasis in both lower lobes.  No infiltrate, pleural effusion or pneumothorax.  Air under the hemidiaphragms on the previous exam no longer seen.  IMPRESSION: BILATERAL calcified pleural plaque disease suggesting prior asbestos exposure.  Bibasilar atelectasis.   Electronically Signed   By: Ulyses SouthwardMark  Boles M.D.   On: 04/30/2015 12:01   Dg Chest 2 View  04/29/2015   CLINICAL DATA:  Worsening hypoxia  EXAM: CHEST  2 VIEW  COMPARISON:  04/24/2015.  Abdominal radiography yesterday.  FINDINGS: Small amount of pneumoperitoneum again seen postoperatively as expected.  Heart size is normal. Mediastinal shadows are normal. There is chronic pleural density with calcification likely related to previous asbestos exposure. There is mild volume loss at both lung bases, with associated small effusions. This is worsened compared to the previous study.  IMPRESSION: Worsening of small effusions and basilar atelectasis.  Pulmonary and pleural changes of chronic asbestos exposure.  Intraperitoneal air postoperatively.   Electronically Signed   By: Paulina FusiMark  Shogry M.D.   On: 04/29/2015 18:12    Assessment/Plan: He appears to have a postop gastrointestinal hemorrhage most likely related to his  ulcer disease. Without endoscopic evaluation it is impossible to be certain that that is the site of his bleeding but in this situation with a anterior perforation he most likely has a posterior penetration. He also became moderately nauseated after his initial surgery which may suggest some partial obstruction. He does need to have an endoscopy at some point.  Currently he is stable. He is getting 2 further units of  blood. We'll recheck his hemoglobin and his pro time. If he bleeds again I will ask GI to consider an upper endoscopy. We would plan on an elective evaluation after he returns to his home town and possible consideration for more aggressive surgical intervention. I discussed this plan with the family and the patient in detail. They are in agreement.

## 2015-05-01 NOTE — Progress Notes (Signed)
2 small volume bloody stools overnight. HGB with modest fall ( 7.2 to 6.8) after yesterday's transfusion.  INR < 1.3 s/p FFP yesterday. Plan: Transfuse 2 units PRBC based on age and previous significant bleed.

## 2015-05-01 NOTE — Consult Note (Signed)
Subjective: Patient seen for GI bleed/melena, hematemesis. Patient has had no repeat hematemesis since yesterday am.  Several bloody stools, though less in volume and frequency.   Denies abdominal pain.  NGT in place with green thin bilious material in place. Slight decline hgb this am/  Hemodynamically stable, one episode of tachycardia today,not as pronounced as yesterday. BUN normal/stable.  Objective: Vital signs in last 24 hours: Temp:  [97.6 F (36.4 C)-99.5 F (37.5 C)] 98.8 F (37.1 C) (07/04 1300) Pulse Rate:  [88-115] 97 (07/04 1300) Resp:  [13-23] 16 (07/04 1300) BP: (113-146)/(54-75) 146/65 mmHg (07/04 1300) SpO2:  [94 %-100 %] 100 % (07/04 1300) Blood pressure 146/65, pulse 97, temperature 98.8 F (37.1 C), temperature source Oral, resp. rate 16, height 6' (1.829 m), weight 69.945 kg (154 lb 3.2 oz), SpO2 100 %.   Intake/Output from previous day: 07/03 0701 - 07/04 0700 In: 3297 [P.O.:60; I.V.:1770; Blood:772; IV Piggyback:675] Out: 1925 [Urine:1375; Emesis/NG output:300; Stool:250]  Intake/Output this shift: Total I/O In: 555 [I.V.:275; Blood:280] Out: -    General appearance:  Elderly male NAD Resp:  cta Cardio:  rrr without rub/gallop GI:  Soft, no/minimal discomfort to palpation, bowel sounds positive. No rebound no distension.  Extremities:  No cce   Lab Results: Results for orders placed or performed during the hospital encounter of 04/24/15 (from the past 24 hour(s))  APTT     Status: None   Collection Time: 05/01/15 12:16 AM  Result Value Ref Range   aPTT 34 24 - 36 seconds  CBC     Status: Abnormal   Collection Time: 05/01/15 12:16 AM  Result Value Ref Range   WBC 16.8 (H) 3.8 - 10.6 K/uL   RBC 2.31 (L) 4.40 - 5.90 MIL/uL   Hemoglobin 7.2 (L) 13.0 - 18.0 g/dL   HCT 16.1 (L) 09.6 - 04.5 %   MCV 93.3 80.0 - 100.0 fL   MCH 31.0 26.0 - 34.0 pg   MCHC 33.2 32.0 - 36.0 g/dL   RDW 40.9 81.1 - 91.4 %   Platelets 154 150 - 440 K/uL  Protime-INR      Status: Abnormal   Collection Time: 05/01/15 12:16 AM  Result Value Ref Range   Prothrombin Time 16.1 (H) 11.4 - 15.0 seconds   INR 1.27   Basic metabolic panel     Status: Abnormal   Collection Time: 05/01/15 12:16 AM  Result Value Ref Range   Sodium 141 135 - 145 mmol/L   Potassium 3.7 3.5 - 5.1 mmol/L   Chloride 109 101 - 111 mmol/L   CO2 28 22 - 32 mmol/L   Glucose, Bld 96 65 - 99 mg/dL   BUN 15 6 - 20 mg/dL   Creatinine, Ser 7.82 0.61 - 1.24 mg/dL   Calcium 7.4 (L) 8.9 - 10.3 mg/dL   GFR calc non Af Amer >60 >60 mL/min   GFR calc Af Amer >60 >60 mL/min   Anion gap 4 (L) 5 - 15  Basic metabolic panel     Status: Abnormal   Collection Time: 05/01/15  5:18 AM  Result Value Ref Range   Sodium 143 135 - 145 mmol/L   Potassium 3.6 3.5 - 5.1 mmol/L   Chloride 109 101 - 111 mmol/L   CO2 28 22 - 32 mmol/L   Glucose, Bld 92 65 - 99 mg/dL   BUN 13 6 - 20 mg/dL   Creatinine, Ser 9.56 0.61 - 1.24 mg/dL   Calcium 7.6 (L) 8.9 -  10.3 mg/dL   GFR calc non Af Amer >60 >60 mL/min   GFR calc Af Amer >60 >60 mL/min   Anion gap 6 5 - 15  CBC     Status: Abnormal   Collection Time: 05/01/15  5:18 AM  Result Value Ref Range   WBC 15.7 (H) 3.8 - 10.6 K/uL   RBC 2.15 (L) 4.40 - 5.90 MIL/uL   Hemoglobin 6.9 (L) 13.0 - 18.0 g/dL   HCT 16.1 (L) 09.6 - 04.5 %   MCV 93.3 80.0 - 100.0 fL   MCH 32.0 26.0 - 34.0 pg   MCHC 34.3 32.0 - 36.0 g/dL   RDW 40.9 81.1 - 91.4 %   Platelets 171 150 - 440 K/uL      Recent Labs  04/30/15 1014 05/01/15 0016 05/01/15 0518  WBC 17.2* 16.8* 15.7*  HGB 5.1* 7.2* 6.9*  HCT 15.7* 21.5* 20.0*  PLT 174 154 171   BMET  Recent Labs  05/01/15 0016 05/01/15 0518  NA 141 143  K 3.7 3.6  CL 109 109  CO2 28 28  GLUCOSE 96 92  BUN 15 13  CREATININE 0.87 0.85  CALCIUM 7.4* 7.6*   LFT No results for input(s): PROT, ALBUMIN, AST, ALT, ALKPHOS, BILITOT, BILIDIR, IBILI in the last 72 hours. PT/INR  Recent Labs  04/30/15 1014 05/01/15 0016  LABPROT  20.6* 16.1*  INR 1.75 1.27   Hepatitis Panel No results for input(s): HEPBSAG, HCVAB, HEPAIGM, HEPBIGM in the last 72 hours. C-Diff  Recent Labs  04/29/15 2200  CDIFFTOX NEGATIVE   No results for input(s): CDIFFPCR in the last 72 hours.   Studies/Results: Dg Chest 1 View  04/30/2015   CLINICAL DATA:  Central line clotted  EXAM: CHEST  1 VIEW  COMPARISON:  Portable exam 1139 hr compared to 04/29/2015  FINDINGS: LEFT subclavian central venous catheter with tip projecting over SVC.  Nasogastric tube extends into stomach.  Normal heart size, mediastinal contours and pulmonary vascularity.  BILATERAL calcified pleural plaques question prior asbestos exposure.  Mild blunting of RIGHT costophrenic angle unchanged.  Subsegmental atelectasis in both lower lobes.  No infiltrate, pleural effusion or pneumothorax.  Air under the hemidiaphragms on the previous exam no longer seen.  IMPRESSION: BILATERAL calcified pleural plaque disease suggesting prior asbestos exposure.  Bibasilar atelectasis.   Electronically Signed   By: Ulyses Southward M.D.   On: 04/30/2015 12:01   Dg Chest 2 View  04/29/2015   CLINICAL DATA:  Worsening hypoxia  EXAM: CHEST  2 VIEW  COMPARISON:  04/24/2015.  Abdominal radiography yesterday.  FINDINGS: Small amount of pneumoperitoneum again seen postoperatively as expected.  Heart size is normal. Mediastinal shadows are normal. There is chronic pleural density with calcification likely related to previous asbestos exposure. There is mild volume loss at both lung bases, with associated small effusions. This is worsened compared to the previous study.  IMPRESSION: Worsening of small effusions and basilar atelectasis.  Pulmonary and pleural changes of chronic asbestos exposure.  Intraperitoneal air postoperatively.   Electronically Signed   By: Paulina Fusi M.D.   On: 04/29/2015 18:12    Scheduled Inpatient Medications:   . [START ON 05/03/2015] pantoprazole (PROTONIX) IV  40 mg Intravenous Q12H     Continuous Inpatient Infusions:   . sodium chloride Stopped (05/01/15 0905)  . pantoprozole (PROTONIX) infusion 8 mg/hr (05/01/15 0618)    PRN Inpatient Medications:  acetaminophen, chlorproMAZINE (THORAZINE) IV, HYDROmorphone (DILAUDID) injection, ondansetron **OR** ondansetron (ZOFRAN) IV  Miscellaneous:  Assessment:  1) upper GI bleeding-hemodynamically stable.  NGT without blood, several small episodes of melena.   Plan:  1) agree that if patient has significant repeat bleeding, prior to surgery,  would need EGD to help delineate nature of source.  Currently is relatively stable, continue conservative management with serial hgb, transfuse as needed, would repeat one u FFP as he may have some occult liver disease that causes his elevated PT previously.  Discussed with Dr Michela PitcherEly. Following.   Christena DeemMartin U Dimitri Shakespeare MD 05/01/2015, 2:13 PM

## 2015-05-01 NOTE — Progress Notes (Signed)
Called dr byrnette about low hgb orders give to set pt up for 2unit no order to give Michael Lowe,Michael Lowe M, RN

## 2015-05-02 DIAGNOSIS — E440 Moderate protein-calorie malnutrition: Secondary | ICD-10-CM | POA: Insufficient documentation

## 2015-05-02 LAB — TYPE AND SCREEN
ABO/RH(D): B POS
Antibody Screen: NEGATIVE
UNIT DIVISION: 0
UNIT DIVISION: 0
Unit division: 0
Unit division: 0

## 2015-05-02 LAB — CBC
HCT: 29.8 % — ABNORMAL LOW (ref 40.0–52.0)
HEMOGLOBIN: 9.8 g/dL — AB (ref 13.0–18.0)
MCH: 30.3 pg (ref 26.0–34.0)
MCHC: 33 g/dL (ref 32.0–36.0)
MCV: 91.8 fL (ref 80.0–100.0)
Platelets: 239 10*3/uL (ref 150–440)
RBC: 3.25 MIL/uL — AB (ref 4.40–5.90)
RDW: 15.7 % — AB (ref 11.5–14.5)
WBC: 22.1 10*3/uL — ABNORMAL HIGH (ref 3.8–10.6)

## 2015-05-02 LAB — PROTIME-INR
INR: 1.25
PROTHROMBIN TIME: 15.9 s — AB (ref 11.4–15.0)

## 2015-05-02 MED ORDER — SODIUM CHLORIDE 0.9 % IV SOLN
INTRAVENOUS | Status: DC
  Administered 2015-05-03 – 2015-05-04 (×4): via INTRAVENOUS

## 2015-05-02 MED ORDER — SIMVASTATIN 40 MG PO TABS
40.0000 mg | ORAL_TABLET | Freq: Every day | ORAL | Status: DC
  Administered 2015-05-03 – 2015-05-06 (×5): 40 mg via ORAL
  Filled 2015-05-02 (×5): qty 1

## 2015-05-02 NOTE — Progress Notes (Signed)
Dr. Michela PitcherEly on floor and gave verbal order to transfer patient to floor and to discontinue foley. Patient is A&Ox4. Denies pain. VSS. Afebrile. NSR per cardiac monitor. Tolerating sips of clear liquids and ice chips. Abdominal incision dry and intact with no drainage. One stool this shift that was brown/tan in color. Family visited throughout shift.  Verta Riedlinger B

## 2015-05-02 NOTE — Progress Notes (Signed)
   05/02/15 0900  Clinical Encounter Type  Visited With Patient and family together  Visit Type Follow-up  Referral From Chaplain  Consult/Referral To Chaplain  Spiritual Encounters  Spiritual Needs Emotional;Prayer  Stress Factors  Patient Stress Factors Exhausted;Health changes  Family Stress Factors Family relationships;Health changes;Major life changes  Visited with patient & family to offer spiritual care and prayer. Accompanied by Mirna Mireshap. Alinda MoneyMelvin.  Chap. Shakena Callari G. Cylinda Santoli, ext. [phone]

## 2015-05-02 NOTE — Care Management (Signed)
Important Message  Patient Details  Name: Oswald HillockGussie Staup MRN: [id number] Date of Birth: 02-23-1932   Medicare Important Message Given:  Yes-fourth notification given    Verita SchneidersKathy A Allmond 05/02/2015, 11:24 AM

## 2015-05-02 NOTE — Progress Notes (Addendum)
Initial Nutrition Assessment  Non-severe (moderate) malnutrition in context of acute illness/injury   INTERVENTION:  1. Meals and Snacks: recommend advancing diet as tolerated, spoke with MD Michela PitcherEly via telephone, hoping to be able to advance diet in next day or so 2. Medical Food Supplement Therapy: recommend addition of nutritional supplement once diet advanced 3. Coordination of Care: if unable to advance diet within 24-48 hours, recommend initiation of nutrition support   NUTRITION DIAGNOSIS:  Inadequate oral intake related to inability to eat as evidenced by NPO status. Continues  GOAL:   (Diet advancement as medically able )   MONITOR:   (Energy Intake, Electrolyte and Renal Profile, Digestive System)   ASSESSMENT:  Pt with postop GI bleed, no bleeding at present, possible partial obstruction   Diet Order: NPO with icechips, sips of CL per MD; NPO/CL day 8  Current Nutrition: pt tolerating icechips, drank coffee this AM; pt hungry, wanting solid foods   Gastrointestinal Profile: NG removed this AM, no N/V, no abdominal pain, no signs of active bleeding at present Last BM: 3 liquid stool during the night, dark brown  Medications: NS ast 125 ml/hr, protonix  Electrolyte/Renal Profile and Glucose Profile:   Recent Labs Lab 04/27/15 0620 05/01/15 0016 05/01/15 0518  NA 135 141 143  K 3.6 3.7 3.6  CL 105 109 109  CO2 23 28 28   BUN 13 15 13   CREATININE 0.90 0.87 0.85  CALCIUM 7.9* 7.4* 7.6*  GLUCOSE 119* 96 92   Nutritional Anemia Profile:  CBC Latest Ref Rng 05/02/2015 05/01/2015 05/01/2015  WBC 3.8 - 10.6 K/uL 22.1(H) - 15.7(H)  Hemoglobin 13.0 - 18.0 g/dL 1.6(X9.8(L) 0.9(U9.9(L) 6.9(L)  Hematocrit 40.0 - 52.0 % 29.8(L) 28.8(L) 20.0(L)  Platelets 150 - 440 K/uL 239 - 171      Nutrition-Focused Physical Exam Findings: Nutrition-Focused physical exam completed. Findings are no fat depletion, mild muscle depletion.     Weight Trend since Admission: Filed Weights   04/24/15 1601 04/25/15 0205 05/02/15 0550  Weight: 160 lb (72.576 kg) 154 lb 3.2 oz (69.945 kg) 154 lb 8.7 oz (70.1 kg)   Pt reports UBW around 160 pounds; 3.8% wt loss  Height:  Ht Readings from Last 1 Encounters:  04/24/15 6' (1.829 m)    Weight:  Wt Readings from Last 1 Encounters:  05/02/15 154 lb 8.7 oz (70.1 kg)    Ideal Body Weight:     Wt Readings from Last 10 Encounters:  05/02/15 154 lb 8.7 oz (70.1 kg)    BMI:  Body mass index is 20.96 kg/(m^2).  Estimated Nutritional Needs:  Kcal:  1856-2139kcals, BEE: 1406kcals, TEE: (IF 1.1-1.3)(AF 1.2)   Protein:  70-84g protein (1.0-1.2g/kg)  Fluid:  1750-21200mL of fluid (25-2130mL/kg)  Skin:  Reviewed, no issues  EDUCATION NEEDS:  No education needs identified at this time   Intake/Output Summary (Last 24 hours) at 05/02/15 1303 Last data filed at 05/02/15 1248  Gross per 24 hour  Intake 3357.5 ml  Output   3050 ml  Net  307.5 ml    HIGH Care Level  Romelle Starcherate Jonnelle Lawniczak MS, RD, LDN [phone] Pager

## 2015-05-02 NOTE — Progress Notes (Signed)
RN notified Dr. Michela PitcherEly that patient is asking for ice chips, hgb 9.8 and VSS and that patient had 3 stools over night that were liquid brown. Dr. Michela PitcherEly stated "take his NG tube out and give him ice chips and sips."  Ambar Raphael B

## 2015-05-02 NOTE — Progress Notes (Signed)
PT Cancellation Note  Patient Details Name: Michael HillockGussie Lowe MRN: [id number] DOB: 03-28-1932   Cancelled Treatment:    Reason Eval/Treat Not Completed: Other (comment) (Pt transferred to ICU 04/30/15 (change in status); no new PT order or continuation of PT upon transfer order in system.  Nursing notified and reported that they would attempt to obtain new PT order.  Will hold PT until new order received.)   Hendricks LimesEmily Akyia Borelli 05/02/2015, 10:22 AM Hendricks LimesEmily Fredi Geiler, PT [phone]

## 2015-05-02 NOTE — Progress Notes (Signed)
He appears to be progressing well on clear liquids. We will liberalize his activity and diet. His hemoglobin remained stable. We will transfer him to the floor for normal floor care.

## 2015-05-02 NOTE — Evaluation (Signed)
Physical Therapy Re-Evaluation Patient Details Name: Michael Lowe MRN: [id number] DOB: [birth year] Today's Date: 05/02/2015   History of Present Illness  Pt is an 81 yo male with onset of perforation and repair of duodenum and now has possible PNA.  Pt transferred to ICU 04/30/15 d/t hematemesis, melena, seizure type activity, and NG tube temporarily placed.  Pt also s/p multiple PRBC's transfusions d/t low hemoglobin.  PMHx:  asbestos exposure and aortoiliac atherosclerosis   Clinical Impression  New PT consult received s/p transfer to ICU (pt seen for PT re-eval).  Currently pt demonstrates impairments with balance, strength, and limitations with functional mobility.  Intermittent confusion noted during session.  Prior to admission, pt was independent ambulating without AD and was living with his wife.  Currently pt requires increased assist with transfers and ambulation compared to baseline and also compared to prior to transfer to ICU.  Pt would benefit from skilled PT to address above noted impairments and functional limitations.  Recommend pt discharge to STR when medically appropriate.     Follow Up Recommendations SNF    Equipment Recommendations  Rolling walker with 5" wheels    Recommendations for Other Services       Precautions / Restrictions Precautions Precautions: Fall Restrictions Weight Bearing Restrictions: No      Mobility  Bed Mobility Overal bed mobility: Needs Assistance Bed Mobility: Supine to Sit     Supine to sit: Supervision;HOB elevated;+2 for safety/equipment     General bed mobility comments: assist for lines; vc's required for logrolling technique d/t abdominal incision  Transfers Overall transfer level: Needs assistance Equipment used: None Transfers: Sit to/from [employer] Sit to Stand: Min assist;Mod assist;+2 safety/equipment Stand pivot transfers: Min assist;Mod assist;+2 safety/equipment       General transfer  comment: vc's required for taking bigger steps; vc's for hand and feet placement  Ambulation/Gait Ambulation/Gait assistance: Min assist;Mod assist;+2 safety/equipment Ambulation Distance (Feet): 3 Feet Assistive device: None       General Gait Details: decreased B step length/foot clearance; decreased cadence; assist to steady  Careers information officer    Modified Rankin (Stroke Patients Only)       Balance Overall balance assessment: Needs assistance Sitting-balance support: Feet supported Sitting balance-Leahy Scale: Good     Standing balance support: No upper extremity supported;During functional activity Standing balance-Leahy Scale: Fair                               Pertinent Vitals/Pain Pain Assessment: No/denies pain  Pt's HR 90-93 bpm during session. Pt's BP initially 153/71 beginning of session and 168/77 after transfer to recliner.    Home Living Family/patient expects to be discharged to:: Skilled nursing facility Living Arrangements: Spouse/significant other Available Help at Discharge: Family Type of Home: House Home Access: Stairs to enter Entrance Stairs-Rails: Left Entrance Stairs-Number of Steps: 2 Home Layout: One level Home Equipment: None      Prior Function Level of Independence: Independent               Hand Dominance        Extremity/Trunk Assessment   Upper Extremity Assessment: Generalized weakness           Lower Extremity Assessment: Generalized weakness      Cervical / Trunk Assessment: Normal  Communication   Communication: No difficulties (Some confusion noted)  Cognition Arousal/Alertness: Awake/alert Behavior  During Therapy: Flat affect Overall Cognitive Status: History of cognitive impairments - at baseline Area of Impairment: Orientation;Following commands;Memory;Problem solving Orientation Level: Disoriented to;Person (Pt did not know DOB initially)   Memory:  Decreased recall of precautions;Decreased short-term memory Following Commands: Follows one step commands consistently     Problem Solving: Slow processing      General Comments General comments (skin integrity, edema, etc.): Abdominal incision intact  Nursing cleared pt for participation in physical therapy.  Pt agreeable to PT session.  Pt's wife present for part of PT session.    Exercises        Assessment/Plan    PT Assessment Patient needs continued PT services  PT Diagnosis Difficulty walking;Generalized weakness   PT Problem List Decreased strength;Decreased activity tolerance;Decreased balance;Decreased mobility;Decreased knowledge of use of DME;Decreased knowledge of precautions  PT Treatment Interventions DME instruction;Gait training;Stair training;Functional mobility training;Therapeutic activities;Therapeutic exercise;Balance training;Patient/family education   PT Goals (Current goals can be found in the Care Plan section) Acute Rehab PT Goals Patient Stated Goal: to walk further PT Goal Formulation: With patient Time For Goal Achievement: 05/16/15 Potential to Achieve Goals: Good    Frequency Min 2X/week   Barriers to discharge        Co-evaluation               End of Session Equipment Utilized During Treatment: Gait belt Activity Tolerance: Patient tolerated treatment well Patient left: in chair;with call bell/phone within reach;with family/visitor present (nursing aware pt does not have chair alarm (nursing reports pt's wife in room and pt is in line of site of nursing so ok not to have chair alarm)) Nurse Communication: Mobility status         Time: 1540-1600 PT Time Calculation (min) (ACUTE ONLY): 20 min   Charges:   PT Evaluation $PT Re-evaluation: 1 Procedure     PT G CodesHendricks Limes:        Jonalyn Sedlak 05/02/2015, 4:33 PM Hendricks LimesEmily Revis Whalin, PT [phone]

## 2015-05-02 NOTE — Consult Note (Signed)
Subjective: Patient seen for hematemesis, melena. Patient has been hemodynamically stable. His last 2 stools have been more brown in color and his hemoglobin is stable as well. He denies any nausea or abdominal pain.  Objective: Vital signs in last 24 hours: Temp:  [97.7 F (36.5 C)-99.5 F (37.5 C)] 98.3 F (36.8 C) (07/05 1200) Pulse Rate:  [86-102] 92 (07/05 0800) Resp:  [11-27] 17 (07/05 1300) BP: (120-154)/(60-84) 142/64 mmHg (07/05 1300) SpO2:  [94 %-100 %] 100 % (07/05 1200) Weight:  [70.1 kg (154 lb 8.7 oz)] 70.1 kg (154 lb 8.7 oz) (07/05 0550) Blood pressure 142/64, pulse 92, temperature 98.3 F (36.8 C), temperature source Oral, resp. rate 17, height 6' (1.829 m), weight 70.1 kg (154 lb 8.7 oz), SpO2 100 %.   Intake/Output from previous day: 07/04 0701 - 07/05 0700 In: 3110 [I.V.:2725; Blood:280] Out: 2750 [Urine:2350; Emesis/NG output:400]  Intake/Output this shift: Total I/O In: 982.5 [P.O.:120; I.V.:837.5; IV Piggyback:25] Out: 300 [Urine:300]   General appearance:  81 year old male no acute distress brighter affect today. Resp:  Bilateral clear to auscultation Cardio:  Regular rate and rhythm GI:  Soft nontender nondistended bowel sounds positive. Midline incision with staples Extremities:  No clubbing cyanosis or edema   Lab Results: Results for orders placed or performed during the hospital encounter of 04/24/15 (from the past 24 hour(s))  Protime-INR     Status: Abnormal   Collection Time: 05/01/15  6:55 PM  Result Value Ref Range   Prothrombin Time 16.3 (H) 11.4 - 15.0 seconds   INR 1.29   Hemoglobin and hematocrit, blood     Status: Abnormal   Collection Time: 05/01/15  6:55 PM  Result Value Ref Range   Hemoglobin 9.9 (L) 13.0 - 18.0 g/dL   HCT 16.128.8 (L) 09.640.0 - 04.552.0 %  CBC     Status: Abnormal   Collection Time: 05/02/15  6:17 AM  Result Value Ref Range   WBC 22.1 (H) 3.8 - 10.6 K/uL   RBC 3.25 (L) 4.40 - 5.90 MIL/uL   Hemoglobin 9.8 (L) 13.0 -  18.0 g/dL   HCT 40.929.8 (L) 81.140.0 - 91.452.0 %   MCV 91.8 80.0 - 100.0 fL   MCH 30.3 26.0 - 34.0 pg   MCHC 33.0 32.0 - 36.0 g/dL   RDW 78.215.7 (H) 95.611.5 - 21.314.5 %   Platelets 239 150 - 440 K/uL  Protime-INR     Status: Abnormal   Collection Time: 05/02/15  6:17 AM  Result Value Ref Range   Prothrombin Time 15.9 (H) 11.4 - 15.0 seconds   INR 1.25       Recent Labs  05/01/15 0016 05/01/15 0518 05/01/15 1855 05/02/15 0617  WBC 16.8* 15.7*  --  22.1*  HGB 7.2* 6.9* 9.9* 9.8*  HCT 21.5* 20.0* 28.8* 29.8*  PLT 154 171  --  239   BMET  Recent Labs  05/01/15 0016 05/01/15 0518  NA 141 143  K 3.7 3.6  CL 109 109  CO2 28 28  GLUCOSE 96 92  BUN 15 13  CREATININE 0.87 0.85  CALCIUM 7.4* 7.6*   LFT No results for input(s): PROT, ALBUMIN, AST, ALT, ALKPHOS, BILITOT, BILIDIR, IBILI in the last 72 hours. PT/INR  Recent Labs  05/01/15 1855 05/02/15 0617  LABPROT 16.3* 15.9*  INR 1.29 1.25   Hepatitis Panel No results for input(s): HEPBSAG, HCVAB, HEPAIGM, HEPBIGM in the last 72 hours. C-Diff  Recent Labs  04/29/15 2200  CDIFFTOX NEGATIVE   No results  for input(s): CDIFFPCR in the last 72 hours.   Studies/Results: No results found.  Scheduled Inpatient Medications:   . [START ON 05/03/2015] pantoprazole (PROTONIX) IV  40 mg Intravenous Q12H    Continuous Inpatient Infusions:   . sodium chloride 125 mL/hr at 05/02/15 0900  . pantoprozole (PROTONIX) infusion 8 mg/hr (05/02/15 1248)    PRN Inpatient Medications:  acetaminophen, chlorproMAZINE (THORAZINE) IV, HYDROmorphone (DILAUDID) injection, ondansetron **OR** ondansetron (ZOFRAN) IV  Miscellaneous:   Assessment:  1. Gastric ulcer status post Cheree Ditto patch procedure. 2. Upper GI bleed status post #1 above. Stable hemodynamically, stable hemogram. Improving.  Plan:  1. Agree with clear liquids.  Continue IV PPI as you are Following, no new GI recommendations  [name] MD 05/02/2015, 2:02 PM

## 2015-05-02 NOTE — Care Management Note (Signed)
Case Management Note  Patient Details  Name: Oswald HillockGussie Axe MRN: [id number] Date of Birth: Sep 24, 1932  Subjective/Objective:  NG out, progressing to ice chips. HGB 9.8. Protonix gtt infusing. Surgery vs. Endoscopy(?) waitng on surgical and GI input.                   Action/Plan: [facility] in [place] when medically stable.   Expected Discharge Date:                  Expected Discharge Plan:  Skilled Nursing Facility  In-House Referral:  Clinical Social Work  Discharge planning Services     Post Acute Care Choice:    Choice offered to:     DME Arranged:    DME Agency:     HH Arranged:    HH Agency:     Status of Service:  In process, will continue to follow  Medicare Important Message Given:  Yes-third notification given Date Medicare IM Given:    Medicare IM give by:    Date Additional Medicare IM Given:    Additional Medicare Important Message give by:     If discussed at Long Length of Stay Meetings, dates discussed:    Additional Comments:  Marily MemosLisa M Venola Castello, RN 05/02/2015, 10:34 AM

## 2015-05-03 ENCOUNTER — Inpatient Hospital Stay: Payer: Medicare Other

## 2015-05-03 LAB — CBC
HCT: 29.5 % — ABNORMAL LOW (ref 40.0–52.0)
HEMOGLOBIN: 9.8 g/dL — AB (ref 13.0–18.0)
MCH: 30.6 pg (ref 26.0–34.0)
MCHC: 33.3 g/dL (ref 32.0–36.0)
MCV: 91.9 fL (ref 80.0–100.0)
Platelets: 313 10*3/uL (ref 150–440)
RBC: 3.21 MIL/uL — AB (ref 4.40–5.90)
RDW: 15.2 % — ABNORMAL HIGH (ref 11.5–14.5)
WBC: 23.6 10*3/uL — AB (ref 3.8–10.6)

## 2015-05-03 MED ORDER — OXYCODONE-ACETAMINOPHEN 5-325 MG PO TABS: 1.0000 | ORAL_TABLET | Freq: Four times a day (QID) | ORAL | Status: DC | PRN

## 2015-05-03 MED ORDER — CEPHALEXIN 500 MG PO CAPS
500.0000 mg | ORAL_CAPSULE | Freq: Three times a day (TID) | ORAL | Status: DC
  Administered 2015-05-03 – 2015-05-06 (×10): 500 mg via ORAL
  Filled 2015-05-03 (×10): qty 1

## 2015-05-03 NOTE — Consult Note (Addendum)
Subjective: Patient seen for melena, hematemesis.  Doing well, tolerating clears, denies n or abdominalpain.  Passing flatus. Stool brown.  Objective: Vital signs in last 24 hours: Temp:  [97.7 F (36.5 C)-99 F (37.2 C)] 97.7 F (36.5 C) (07/06 0755) Pulse Rate:  [88-93] 93 (07/06 0755) Resp:  [17-30] 19 (07/06 0755) BP: (138-154)/(64-82) 138/68 mmHg (07/06 0755) SpO2:  [98 %-99 %] 98 % (07/06 0755) Weight:  [70.308 kg (155 lb)] 70.308 kg (155 lb) (07/05 2032) Blood pressure 138/68, pulse 93, temperature 97.7 F (36.5 C), temperature source Oral, resp. rate 19, height 6' (1.829 m), weight 70.308 kg (155 lb), SpO2 98 %.   Intake/Output from previous day: 07/05 0701 - 07/06 0700 In: 2549 [P.O.:240; I.V.:2259; IV Piggyback:50] Out: 1525 [Urine:1525]  Intake/Output this shift: Total I/O In: 0  Out: 400 [Urine:400]   General appearance:  89 male nad.  Resp:  bcta Cardio:  rrr GI:  Soft, bowel sounds positive, nondistended, non-tender.   Extremities:     Lab Results: Results for orders placed or performed during the hospital encounter of 04/24/15 (from the past 24 hour(s))  CBC     Status: Abnormal   Collection Time: 05/03/15  6:05 AM  Result Value Ref Range   WBC 23.6 (H) 3.8 - 10.6 K/uL   RBC 3.21 (L) 4.40 - 5.90 MIL/uL   Hemoglobin 9.8 (L) 13.0 - 18.0 g/dL   HCT 16.1 (L) 09.6 - 04.5 %   MCV 91.9 80.0 - 100.0 fL   MCH 30.6 26.0 - 34.0 pg   MCHC 33.3 32.0 - 36.0 g/dL   RDW 40.9 (H) 81.1 - 91.4 %   Platelets 313 150 - 440 K/uL      Recent Labs  05/01/15 0518 05/01/15 1855 05/02/15 0617 05/03/15 0605  WBC 15.7*  --  22.1* 23.6*  HGB 6.9* 9.9* 9.8* 9.8*  HCT 20.0* 28.8* 29.8* 29.5*  PLT 171  --  239 313   BMET  Recent Labs  05/01/15 0016 05/01/15 0518  NA 141 143  K 3.7 3.6  CL 109 109  CO2 28 28  GLUCOSE 96 92  BUN 15 13  CREATININE 0.87 0.85  CALCIUM 7.4* 7.6*   LFT No results for input(s): PROT, ALBUMIN, AST, ALT, ALKPHOS, BILITOT, BILIDIR,  IBILI in the last 72 hours. PT/INR  Recent Labs  05/01/15 1855 05/02/15 0617  LABPROT 16.3* 15.9*  INR 1.29 1.25   Hepatitis Panel No results for input(s): HEPBSAG, HCVAB, HEPAIGM, HEPBIGM in the last 72 hours. C-Diff No results for input(s): CDIFFTOX in the last 72 hours. No results for input(s): CDIFFPCR in the last 72 hours.   Studies/Results: Dg Chest 2 View  05/03/2015   CLINICAL DATA:  Shortness of breath  EXAM: CHEST  2 VIEW  COMPARISON:  04/30/2015  FINDINGS: There is hyperinflation of the lungs compatible with COPD. Calcified bilateral pleural plaques are noted. There are small bilateral pleural effusions. Bibasilar atelectasis or infiltrates, right greater than left. Heart is normal size. Left central line remains in place, unchanged.  IMPRESSION: COPD.  Bilateral calcified pleural plaques. Small bilateral pleural effusions. Bibasilar atelectasis or infiltrates.   Electronically Signed   By: Charlett Nose M.D.   On: 05/03/2015 09:53    Scheduled Inpatient Medications:   . cephALEXin  500 mg Oral 3 times per day  . pantoprazole (PROTONIX) IV  40 mg Intravenous Q12H  . simvastatin  40 mg Oral QHS    Continuous Inpatient Infusions:   . sodium  chloride 75 mL/hr at 05/03/15 0016    PRN Inpatient Medications:  acetaminophen, chlorproMAZINE (THORAZINE) IV, HYDROmorphone (DILAUDID) injection, ondansetron **OR** ondansetron (ZOFRAN) IV, oxyCODONE-acetaminophen  Miscellaneous:   Assessment:  1) gastric ulcer, s/p Cheree DittoGraham patch repair of perforation.  Likely nsaid related.  2) ugi bleeding, stable, no evidence of ongoing bleeding.  3) leukocytosis.  Plan:  1) discussed with Dr Michela PitcherEly, continue iv ppi, advanced diet lowwly through full liquids before starting low residue/soft.  Will need bid ppi for 3 months  Then continue daily.  No further nsaid use. Will follow at a distance.   Christena DeemMartin U Skulskie MD 05/03/2015, 1:48 PM  Will check h pylori serology.

## 2015-05-03 NOTE — Progress Notes (Signed)
9 Days Post-Op   Subjective:  He is feeling better today. He has no significant abdominal pain. He still coughing up a little bit of phlegm but otherwise has no major complaints. He's had no further vomiting has not had any bloody bowel movements. Her graft is concerned present time is his elevated white blood cell count which is now 23,000. Hemoglobin remained stable at 9.8. Chest x-ray today showed some mild pleural effusion on the right side but otherwise no significant abnormalities from his previous films.  Vital signs in last 24 hours: Temp:  [97.7 F (36.5 C)-99 F (37.2 C)] 97.7 F (36.5 C) (07/06 0755) Pulse Rate:  [88-93] 93 (07/06 0755) Resp:  [17-30] 19 (07/06 0755) BP: (138-154)/(64-82) 138/68 mmHg (07/06 0755) SpO2:  [98 %-99 %] 98 % (07/06 0755) Weight:  [70.308 kg (155 lb)] 70.308 kg (155 lb) (07/05 2032) Last BM Date: 05/02/15  Intake/Output from previous day: 07/05 0701 - 07/06 0700 In: 2549 [P.O.:240; I.V.:2259; IV Piggyback:50] Out: 1525 [Urine:1525]  GI: His abdomen looks good with minimal abdominal tenderness. He does have some significant erythema around the midline and I wonder if that is not the source of his white blood cell count. There is no drainage at the present time.  He is continuing to have some upper respiratory congestion but his lungs remain clear on examination. Lab Results:  CBC  Recent Labs  05/02/15 0617 05/03/15 0605  WBC 22.1* 23.6*  HGB 9.8* 9.8*  HCT 29.8* 29.5*  PLT 239 313   CMP     Component Value Date/Time   NA 143 05/01/2015 0518   K 3.6 05/01/2015 0518   CL 109 05/01/2015 0518   CO2 28 05/01/2015 0518   GLUCOSE 92 05/01/2015 0518   BUN 13 05/01/2015 0518   CREATININE 0.85 05/01/2015 0518   CALCIUM 7.6* 05/01/2015 0518   PROT 8.2* 04/24/2015 1634   ALBUMIN 4.2 04/24/2015 1634   AST 33 04/24/2015 1634   ALT 17 04/24/2015 1634   ALKPHOS 44 04/24/2015 1634   BILITOT 1.0 04/24/2015 1634   GFRNONAA >60 05/01/2015 0518    GFRAA >60 05/01/2015 0518   PT/INR  Recent Labs  05/01/15 1855 05/02/15 0617  LABPROT 16.3* 15.9*  INR 1.29 1.25    Studies/Results: Dg Chest 2 View  05/03/2015   CLINICAL DATA:  Shortness of breath  EXAM: CHEST  2 VIEW  COMPARISON:  04/30/2015  FINDINGS: There is hyperinflation of the lungs compatible with COPD. Calcified bilateral pleural plaques are noted. There are small bilateral pleural effusions. Bibasilar atelectasis or infiltrates, right greater than left. Heart is normal size. Left central line remains in place, unchanged.  IMPRESSION: COPD.  Bilateral calcified pleural plaques. Small bilateral pleural effusions. Bibasilar atelectasis or infiltrates.   Electronically Signed   By: Charlett NoseKevin  Dover M.D.   On: 05/03/2015 09:53    Assessment/Plan: We will continue to advance his diet. I'll switch him to by mouth antibiotics. We'll also try to get him onto oral pain medications. We'll increase his activity level. We'll follow his white count. If he begins to drain from his abdominal midline wound I will remove several of the staples. I talk with him about this in detail.

## 2015-05-03 NOTE — Progress Notes (Signed)
Physical Therapy Treatment Patient Details Name: Michael Lowe MRN: [id number] DOB: [birth year] Today's Date: 05/03/2015    History of Present Illness Pt is an 81 yo male with onset of perforation and repair of duodenum and now has possible PNA.  Pt transferred to ICU 04/30/15 d/t hematemesis, melena, seizure type activity, and NG tube temporarily placed.  Pt also s/p multiple PRBC's transfusions d/t low hemoglobin.  Pt now transferred to floor.  PMHx:  asbestos exposure and aortoiliac atherosclerosis     PT Comments    Pt progressing well with functional mobility and was CGA to min assist (to steady) ambulating without AD this afternoon.  Nursing reports ambulating pt earlier today and was CGA with RW.  Anticipate pt will be able to discharge home with support of family and HHPT (care management notified).  Pt may need AD (currently recommend RW) for balance with ambulation but PT will need to continue to assess this during pt's hospital stay (pending pt's progress).  Follow Up Recommendations  Home health PT     Equipment Recommendations  Rolling walker with 5" wheels    Recommendations for Other Services       Precautions / Restrictions Precautions Precautions: Fall Restrictions Weight Bearing Restrictions: No    Mobility  Bed Mobility               General bed mobility comments: Not assessed (pt sitting up in chair upon arrival)  Transfers Overall transfer level: Needs assistance Equipment used: None Transfers: Sit to/from Stand;Stand Pivot Transfers Sit to Stand: Supervision Stand pivot transfers: Min guard       General transfer comment: steady without loss of balance  Ambulation/Gait Ambulation/Gait assistance: Min guard;Min assist Ambulation Distance (Feet): 200 Feet Assistive device: None       General Gait Details: occasional loss of balance requiring min assist to steady; step through gait pattern; mild decreased cadence   Stairs             Wheelchair Mobility    Modified Rankin (Stroke Patients Only)       Balance Overall balance assessment: Needs assistance Sitting-balance support: No upper extremity supported;Feet supported Sitting balance-Leahy Scale: Good     Standing balance support: No upper extremity supported Standing balance-Leahy Scale: Good                      Cognition Arousal/Alertness: Awake/alert Behavior During Therapy: WFL for tasks assessed/performed                        Exercises      General Comments  Nursing cleared pt for participation.  Pt agreeable to short PT session d/t being fatigued from walking with nursing earlier.      Pertinent Vitals/Pain Pain Assessment: No/denies pain  Vitals stable and WFL throughout treatment session.     Home Living                      Prior Function            PT Goals (current goals can now be found in the care plan section) Acute Rehab PT Goals Patient Stated Goal: to walk further PT Goal Formulation: With patient Time For Goal Achievement: 05/16/15 Potential to Achieve Goals: Good Progress towards PT goals: Progressing toward goals    Frequency  Min 2X/week    PT Plan Discharge plan needs to be updated    Co-evaluation  End of Session Equipment Utilized During Treatment: Gait belt Activity Tolerance: Patient tolerated treatment well Patient left: in chair;with call bell/phone within reach;with chair alarm set     Time: [id number] PT Time Calculation (min) (ACUTE ONLY): 13 min  Charges:  $Gait Training: 8-22 mins                    G CodesHendricks Limes:      Jayce Boyko 05/03/2015, 4:25 PM Hendricks LimesEmily Ailine Hefferan, PT [phone]

## 2015-05-03 NOTE — Clinical Social Work Note (Signed)
CSW attempted to reach Kami at [facility] in [place] to give her an update but her voice mail box was full and could not accept messages. CSW will try to call back again today. York SpanielMonica Pershing Skidmore MSW,LCSWA [phone]

## 2015-05-04 NOTE — Clinical Social Work Note (Signed)
PT now recommending patient can return home. Patient and patient's wife are aware and are wanting to return home. CSW has updated Kami at [facility] [place]. York SpanielMonica Ariyonna Twichell MSW,LCSWA [phone]

## 2015-05-04 NOTE — Care Management (Signed)
Important Message  Patient Details  Name: Michael Lowe MRN: Michael Lowe Date of Birth: 1931-12-16   Medicare Important Message Given:  Yes-third notification given    Verita SchneidersKathy A Allmond 05/04/2015, 12:49 PM

## 2015-05-04 NOTE — Care Management (Signed)
Spoke with patient concerning discharge planning. Patient is from home with spouse. Patient is ambulating well in the room without AD.  Patient stated that he doesn't feel the need for home physical therapy. Patient asked if I would call spouse Michael Lowe which I did and she is OK with patient coming home upon discharge. Stated has family support. Patient is not homebound and would not qualify for home health would be more appropriate for Outpatient PT. Patient will need to follow up with PCP Michael Lowe in [place]. After discharge. Will continue to update the spouse as requested.

## 2015-05-04 NOTE — Progress Notes (Signed)
Nutrition Follow-up  DOCUMENTATION CODES:  Non-severe (moderate) malnutrition in context of acute illness/injury  INTERVENTION:   Meals/Snacks: cater to pt preferences; if pt continues to tolerate FL diet, recommend advancement to Soft diet within 24-48 hours Medical Food Supplement: pt agreeable to supplement; will try [employer] Instant Breakfast TID with meals  NUTRITION DIAGNOSIS:  Inadequate oral intake related to inability to eat as evidenced by NPO status. Improving as diet being advanced   GOAL:  Diet advancement within 24-48 hours Patient will meet greater than or equal to 90% of their needs   MONITOR:   (Energy Intake, Electrolyte and Renal Profile, Digestive System)   ASSESSMENT:  Diet Order: FL  Energy Intake: tolerated clear liquids, diet advanced to Regular yesterday. Pt tolerating diet, at lunch today pt reported he ate too much and made him uncomfortable. [facility]  Digestive System: no N/V Last BM: 7/6   Recent Labs Lab 05/01/15 0016 05/01/15 0518  NA 141 143  K 3.7 3.6  CL 109 109  CO2 28 28  BUN 15 13  CREATININE 0.87 0.85  CALCIUM 7.4* 7.6*  GLUCOSE 96 92    Meds: NS at 75 ml/hr,   Height:  Ht Readings from Last 1 Encounters:  04/24/15 6' (1.829 m)    Weight:  Wt Readings from Last 1 Encounters:  05/02/15 155 lb (70.308 kg)     Wt Readings from Last 10 Encounters:  05/02/15 155 lb (70.308 kg)    BMI:  Body mass index is 21.02 kg/(m^2).  Estimated Nutritional Needs:  Kcal:  1856-2139kcals, BEE: 1406kcals, TEE: (IF 1.1-1.3)(AF 1.2)   Protein:  70-84g protein (1.0-1.2g/kg)  Fluid:  1750-213300mL of fluid (25-4130mL/kg)  Skin:  Reviewed, no issues  Diet Order:  Diet full liquid Room service appropriate?: Yes; Fluid consistency:: Thin   HIGH Care Level  Romelle Starcherate Jupiter Kabir MS, RD, LDN [phone] Pager

## 2015-05-04 NOTE — Consult Note (Signed)
Subjective: Patient seen for hematemesis, melena.  Not recurrent for 2 days.  Ate a soft diet today, spitting up some of that material?,  Denies abdominal pain.   Objective: Vital signs in last 24 hours: Temp:  [98.5 F (36.9 C)-99.3 F (37.4 C)] 99.3 F (37.4 C) (07/07 0019) Pulse Rate:  [81-96] 96 (07/07 0019) Resp:  [17-18] 17 (07/07 0019) BP: (141-144)/(67-80) 141/67 mmHg (07/07 0019) SpO2:  [98 %-99 %] 98 % (07/07 0019) Blood pressure 141/67, pulse 96, temperature 99.3 F (37.4 C), temperature source Oral, resp. rate 17, height 6' (1.829 m), weight 70.308 kg (155 lb), SpO2 98 %.   Intake/Output from previous day: 07/06 0701 - 07/07 0700 In: 1888 [P.O.:480; I.V.:1408] Out: 2500 [Urine:2500]  Intake/Output this shift: Total I/O In: 479 [P.O.:120; I.V.:359] Out: 400 [Urine:400]   General appearance:  Elderly male nad Resp:  bcta Cardio:  rrr GI:  Soft non distended, bs positive. Extremities:  No cce   Lab Results: No results found for this or any previous visit (from the past 24 hour(s)).    Recent Labs  05/01/15 1855 05/02/15 0617 05/03/15 0605  WBC  --  22.1* 23.6*  HGB 9.9* 9.8* 9.8*  HCT 28.8* 29.8* 29.5*  PLT  --  239 313   BMET No results for input(s): NA, K, CL, CO2, GLUCOSE, BUN, CREATININE, CALCIUM in the last 72 hours. LFT No results for input(s): PROT, ALBUMIN, AST, ALT, ALKPHOS, BILITOT, BILIDIR, IBILI in the last 72 hours. PT/INR  Recent Labs  05/01/15 1855 05/02/15 0617  LABPROT 16.3* 15.9*  INR 1.29 1.25   Hepatitis Panel No results for input(s): HEPBSAG, HCVAB, HEPAIGM, HEPBIGM in the last 72 hours. C-Diff No results for input(s): CDIFFTOX in the last 72 hours. No results for input(s): CDIFFPCR in the last 72 hours.   Studies/Results: Dg Chest 2 View  05/03/2015   CLINICAL DATA:  Shortness of breath  EXAM: CHEST  2 VIEW  COMPARISON:  04/30/2015  FINDINGS: There is hyperinflation of the lungs compatible with COPD. Calcified  bilateral pleural plaques are noted. There are small bilateral pleural effusions. Bibasilar atelectasis or infiltrates, right greater than left. Heart is normal size. Left central line remains in place, unchanged.  IMPRESSION: COPD.  Bilateral calcified pleural plaques. Small bilateral pleural effusions. Bibasilar atelectasis or infiltrates.   Electronically Signed   By: Charlett NoseKevin  Dover M.D.   On: 05/03/2015 09:53    Scheduled Inpatient Medications:   . cephALEXin  500 mg Oral 3 times per day  . pantoprazole (PROTONIX) IV  40 mg Intravenous Q12H  . simvastatin  40 mg Oral QHS    Continuous Inpatient Infusions:   . sodium chloride 75 mL/hr at 05/04/15 0633    PRN Inpatient Medications:  acetaminophen, chlorproMAZINE (THORAZINE) IV, HYDROmorphone (DILAUDID) injection, ondansetron **OR** ondansetron (ZOFRAN) IV, oxyCODONE-acetaminophen  Miscellaneous:   Assessment:  1) hemetemesis/melena, in the setting of perforated gastric ulcer and repair. Stable, resolved.   Plan:  1) continue bid po protonix as outpatient, in setting of "spitting up" will change diet to full liquids.  Today, might be able to advance tomorrow.  Will sign off. reconsult if needed.   Christena DeemMartin U Skulskie MD 05/04/2015, 1:48 PM

## 2015-05-04 NOTE — Progress Notes (Signed)
Physical Therapy Treatment Patient Details Name: Michael HillockGussie Lowe MRN: [id number] DOB: 06-12-1932 Today's Date: 05/04/2015    History of Present Illness Pt is an 81 yo male with onset of perforation and repair of duodenum and now has possible PNA.  Pt transferred to ICU 04/30/15 d/t hematemesis, melena, seizure type activity, and NG tube temporarily placed.  Pt also s/p multiple PRBC's transfusions d/t low hemoglobin.  Pt now transferred to floor.  PMHx:  asbestos exposure and aortoiliac atherosclerosis     PT Comments    Pt performing very well today with no fatigue, nausea or LOBs during ambulation, transfers, or bed mobility. Pt able to ambulate 220 ft at supervision without assistive device. It is believed by PT that pt can ambulate in the home independently and safely. Pt performs transfers and bed mobility at independence. Pt states no wish for HHPT or use of an assistive device. It is the therapist's opinion that this is an appropriate recommendation for this pt and will recommend no follow-up PT or DME.   Follow Up Recommendations  No PT follow up     Equipment Recommendations  Rolling walker with 5" wheels    Recommendations for Other Services       Precautions / Restrictions Precautions Precautions: Fall Precaution Comments: clearer mentally today Restrictions Weight Bearing Restrictions: No    Mobility  Bed Mobility Overal bed mobility: Independent Bed Mobility: Supine to Sit     Supine to sit: Independent Sit to supine: Independent   General bed mobility comments: Pt requires no assistance for bed mobility  Transfers Overall transfer level: Independent Equipment used: None Transfers: Sit to/from Stand Sit to Stand: Independent         General transfer comment: Pt able to transfer at independence and requires no assistance. No LOBs during transfers up or down  Ambulation/Gait Ambulation/Gait assistance: Supervision (for safety in the hospital) Ambulation  Distance (Feet): 220 Feet Assistive device: None Gait Pattern/deviations: WFL(Within Functional Limits) Gait velocity: slow controlled   General Gait Details: Pt able to ambulate at supervision within the hospital only for safety. No noted LOBs without assistive device.    Stairs            Wheelchair Mobility    Modified Rankin (Stroke Patients Only)       Balance Overall balance assessment: No apparent balance deficits (not formally assessed)                                  Cognition Arousal/Alertness: Awake/alert Behavior During Therapy: WFL for tasks assessed/performed Overall Cognitive Status: History of cognitive impairments - at baseline           Safety/Judgement: Decreased awareness of safety (slightly impulsive due to high level physically)          Exercises      General Comments General comments (skin integrity, edema, etc.): Abdominal incision intact      Pertinent Vitals/Pain Pain Assessment: No/denies pain    Home Living                      Prior Function            PT Goals (current goals can now be found in the care plan section) Acute Rehab PT Goals Patient Stated Goal: To continue to walk PT Goal Formulation: With patient Time For Goal Achievement: 05/16/15 Potential to Achieve Goals: Good Progress towards PT goals:  Progressing toward goals    Frequency  Min 2X/week    PT Plan Discharge plan needs to be updated    Co-evaluation             End of Session Equipment Utilized During Treatment: Gait belt (above incision site) Activity Tolerance: Patient tolerated treatment well Patient left: in bed;with call bell/phone within reach;with chair alarm set     Time: 1448-1459 PT Time Calculation (min) (ACUTE ONLY): 11 min  Charges:                       G CodesBenna Dunks 09-May-2015, 3:37 PM  Benna Dunks, SPT. [phone]

## 2015-05-04 NOTE — Progress Notes (Signed)
10 Days Post-Op   Subjective:  He is doing better overall. He feels better with less pain. He has not had any fever. He still has some pickups but has no nausea or vomiting. He is ambulate without difficulty and appears to be returning to near normal activity levels.  Vital signs in last 24 hours: Temp:  [98.5 F (36.9 C)-99.3 F (37.4 C)] 98.6 F (37 C) (07/07 1534) Pulse Rate:  [81-96] 92 (07/07 1534) Resp:  [16-18] 16 (07/07 1534) BP: (137-144)/(67-80) 137/72 mmHg (07/07 1534) SpO2:  [98 %-100 %] 100 % (07/07 1534) Last BM Date: 05/03/15  Intake/Output from previous day: 07/06 0701 - 07/07 0700 In: 1888 [P.O.:480; I.V.:1408] Out: 2500 [Urine:2500]  GI: His abdomen is soft. The midline erythema is improving. There is no significant drainage. He has active bowel sounds.  Lab Results:  CBC  Recent Labs  05/02/15 0617 05/03/15 0605  WBC 22.1* 23.6*  HGB 9.8* 9.8*  HCT 29.8* 29.5*  PLT 239 313   CMP     Component Value Date/Time   NA 143 05/01/2015 0518   K 3.6 05/01/2015 0518   CL 109 05/01/2015 0518   CO2 28 05/01/2015 0518   GLUCOSE 92 05/01/2015 0518   BUN 13 05/01/2015 0518   CREATININE 0.85 05/01/2015 0518   CALCIUM 7.6* 05/01/2015 0518   PROT 8.2* 04/24/2015 1634   ALBUMIN 4.2 04/24/2015 1634   AST 33 04/24/2015 1634   ALT 17 04/24/2015 1634   ALKPHOS 44 04/24/2015 1634   BILITOT 1.0 04/24/2015 1634   GFRNONAA >60 05/01/2015 0518   GFRAA >60 05/01/2015 0518   PT/INR  Recent Labs  05/01/15 1855 05/02/15 0617  LABPROT 16.3* 15.9*  INR 1.29 1.25    Studies/Results: Dg Chest 2 View  05/03/2015   CLINICAL DATA:  Shortness of breath  EXAM: CHEST  2 VIEW  COMPARISON:  04/30/2015  FINDINGS: There is hyperinflation of the lungs compatible with COPD. Calcified bilateral pleural plaques are noted. There are small bilateral pleural effusions. Bibasilar atelectasis or infiltrates, right greater than left. Heart is normal size. Left central line remains in  place, unchanged.  IMPRESSION: COPD.  Bilateral calcified pleural plaques. Small bilateral pleural effusions. Bibasilar atelectasis or infiltrates.   Electronically Signed   By: Charlett NoseKevin  Dover M.D.   On: 05/03/2015 09:53    Assessment/Plan: He is feeling much better with no further evidence of bleeding. However I am concerned about his white blood count. We'll repeat it tomorrow. The elevation may be due to his wound problem in the midline. On the other hand with his abdominal contamination it is possible that he has a subphrenic or subhepatic abscess. If his white count remains elevated we will repeat his CT scan. We discussed discharge plans with him and social service.

## 2015-05-04 NOTE — Progress Notes (Signed)
Physical Therapy Treatment Patient Details Name: Michael Lowe MRN: [id number] DOB: Mar 01, 1932 Today's Date: 05/04/2015    History of Present Illness Pt is an 81 yo male with onset of perforation and repair of duodenum and now has possible PNA.  Pt transferred to ICU 04/30/15 d/t hematemesis, melena, seizure type activity, and NG tube temporarily placed.  Pt also s/p multiple PRBC's transfusions d/t low hemoglobin.  Pt now transferred to floor.  PMHx:  asbestos exposure and aortoiliac atherosclerosis     PT Comments    Pt performing very well today with no fatigue, nausea or LOBs during ambulation, transfers, or bed mobility. Pt able to ambulate 220 ft at supervision without assistive device. It is believed by PT that pt can ambulate in the home independently and safely. Pt performs transfers and bed mobility at independence. Pt states no wish for HHPT or use of an assistive device. It is the therapist's opinion that this is an appropriate recommendation for this pt and will recommend no follow-up PT or DME.   Follow Up Recommendations  No PT follow up     Equipment Recommendations  None recommended by PT    Recommendations for Other Services       Precautions / Restrictions Precautions Precautions: Fall Precaution Comments: clearer mentally today Restrictions Weight Bearing Restrictions: No    Mobility  Bed Mobility Overal bed mobility: Independent Bed Mobility: Supine to Sit     Supine to sit: Independent Sit to supine: Independent   General bed mobility comments: Pt requires no assistance for bed mobility  Transfers Overall transfer level: Independent Equipment used: None Transfers: Sit to/from Stand Sit to Stand: Independent         General transfer comment: Pt able to transfer at independence and requires no assistance. No LOBs during transfers up or down  Ambulation/Gait Ambulation/Gait assistance: Supervision (for safety in the hospital) Ambulation  Distance (Feet): 220 Feet Assistive device: None Gait Pattern/deviations: WFL(Within Functional Limits) Gait velocity: slow controlled   General Gait Details: Pt able to ambulate at supervision within the hospital only for safety. No noted LOBs without assistive device.    Stairs            Wheelchair Mobility    Modified Rankin (Stroke Patients Only)       Balance Overall balance assessment: No apparent balance deficits (not formally assessed)                                  Cognition Arousal/Alertness: Awake/alert Behavior During Therapy: WFL for tasks assessed/performed Overall Cognitive Status: History of cognitive impairments - at baseline           Safety/Judgement: Decreased awareness of safety (slightly impulsive due to high level physically)          Exercises      General Comments General comments (skin integrity, edema, etc.): Abdominal incision intact      Pertinent Vitals/Pain Pain Assessment: No/denies pain    Home Living                      Prior Function            PT Goals (current goals can now be found in the care plan section) Acute Rehab PT Goals Patient Stated Goal: To continue to walk PT Goal Formulation: With patient Time For Goal Achievement: 05/16/15 Potential to Achieve Goals: Good Progress towards PT goals: Progressing  toward goals    Frequency  Min 2X/week    PT Plan Discharge plan needs to be updated    Co-evaluation             End of Session Equipment Utilized During Treatment: Gait belt (above incision site) Activity Tolerance: Patient tolerated treatment well Patient left: in bed;with call bell/phone within reach;with chair alarm set     Time: 1448-1459 PT Time Calculation (min) (ACUTE ONLY): 11 min  Charges:                       G CodesBenna Dunks:      Michael Lowe 05/04/2015, 3:45 PM  Benna Dunksasey Byrl Latin, SPT. [phone]

## 2015-05-04 NOTE — Care Management (Signed)
Patient from [place].  Coordinating with area home agencies. Will offer patient choice when available coverage is determined. [facility] can support Awaiting response from [facility].

## 2015-05-05 LAB — CBC
HCT: 27.1 % — ABNORMAL LOW (ref 40.0–52.0)
Hemoglobin: 9.2 g/dL — ABNORMAL LOW (ref 13.0–18.0)
MCH: 31.5 pg (ref 26.0–34.0)
MCHC: 33.9 g/dL (ref 32.0–36.0)
MCV: 92.7 fL (ref 80.0–100.0)
Platelets: 376 10*3/uL (ref 150–440)
RBC: 2.92 MIL/uL — ABNORMAL LOW (ref 4.40–5.90)
RDW: 15 % — AB (ref 11.5–14.5)
WBC: 16.8 10*3/uL — ABNORMAL HIGH (ref 3.8–10.6)

## 2015-05-05 LAB — H PYLORI, IGM, IGG, IGA AB
H Pylori IgG: 3.4 U/mL — ABNORMAL HIGH (ref 0.0–0.8)
H. Pylogi, Iga Abs: 13.1 units — ABNORMAL HIGH (ref 0.0–8.9)
H. Pylogi, Igm Abs: <9 units (ref 0.0–8.9)

## 2015-05-05 MED ORDER — IOHEXOL 240 MG/ML SOLN
25.0000 mL | Freq: Once | INTRAMUSCULAR | Status: AC | PRN
  Administered 2015-05-05: 25 mL via ORAL

## 2015-05-05 MED ORDER — HYDROCOD POLST-CPM POLST ER 10-8 MG/5ML PO SUER
5.0000 mL | Freq: Two times a day (BID) | ORAL | Status: DC
  Administered 2015-05-05 – 2015-05-08 (×6): 5 mL via ORAL
  Filled 2015-05-05 (×6): qty 5

## 2015-05-05 MED ORDER — IOHEXOL 240 MG/ML SOLN
25.0000 mL | Freq: Once | INTRAMUSCULAR | Status: AC | PRN
  Administered 2015-05-06: 25 mL via ORAL

## 2015-05-05 NOTE — Progress Notes (Signed)
Physical Therapy Treatment Patient Details Name: Michael Lowe MRN: [id number] DOB: October 04, 1932 Today's Date: 05/05/2015    History of Present Illness Pt is an 81 yo male with onset of perforation and repair of duodenum and now has possible PNA.  Pt transferred to ICU 04/30/15 d/t hematemesis, melena, seizure type activity, and NG tube temporarily placed.  Pt also s/p multiple PRBC's transfusions d/t low hemoglobin.  Pt now transferred to floor.  PMHx:  asbestos exposure and aortoiliac atherosclerosis     PT Comments    Pt is able to perform all mobility independently and currently requires no physical assistance from PT. There are no apparent deficits that are any different from pt's baseline function. Pt has expressed no need for PT any further in the hospital or at home. He is pleasant with staff, but states he does not need therapy. He will be d/c'd in house due to no need for skilled PT at this time.   Follow Up Recommendations  No PT follow up     Equipment Recommendations  None recommended by PT    Recommendations for Other Services       Precautions / Restrictions Precautions Precautions:  (Previously fall risk) Restrictions Weight Bearing Restrictions: No    Mobility  Bed Mobility Overal bed mobility: Independent Bed Mobility: Supine to Sit     Supine to sit: Independent Sit to supine: Independent   General bed mobility comments: Pt requires no assistance for bed mobility  Transfers Overall transfer level: Independent Equipment used: None Transfers: Sit to/from Stand Sit to Stand: Independent         General transfer comment: Pt able to transfer at independence and requires no assistance. No LOBs during transfers up or down  Ambulation/Gait Ambulation/Gait assistance: Independent Ambulation Distance (Feet): 250 Feet Assistive device: None Gait Pattern/deviations: WFL(Within Functional Limits) Gait velocity: slow controlled   General Gait Details: Pt  able to ambulate at supervision within the hospital only for safety. No noted LOBs without assistive device.    Stairs Stairs: Yes Stairs assistance: Supervision Stair Management: One rail Right Number of Stairs: 8 General stair comments: Up/down 8 steps with no noted LOB. Pt is safe with stair navigation.   Wheelchair Mobility    Modified Rankin (Stroke Patients Only)       Balance Overall balance assessment: No apparent balance deficits (not formally assessed) Sitting-balance support: No upper extremity supported Sitting balance-Leahy Scale: Good     Standing balance support: No upper extremity supported Standing balance-Leahy Scale: Good                      Cognition Arousal/Alertness: Awake/alert Behavior During Therapy: WFL for tasks assessed/performed Overall Cognitive Status: History of cognitive impairments - at baseline           Safety/Judgement:  (aware of surroundings and safely navigates environment)          Exercises      General Comments        Pertinent Vitals/Pain Pain Assessment: No/denies pain    Home Living                      Prior Function            PT Goals (current goals can now be found in the care plan section) Acute Rehab PT Goals Patient Stated Goal: To participate in gait and stair training PT Goal Formulation: With patient Time For Goal Achievement: 05/16/15 Potential to Achieve  Goals: Good Progress towards PT goals: Goals met/education completed, patient discharged from PT    Frequency  Min 2X/week    PT Plan Discharge plan needs to be updated (D/C in house)    Co-evaluation             End of Session Equipment Utilized During Treatment: Gait belt Activity Tolerance: Patient tolerated treatment well Patient left: in bed;with call bell/phone within reach     Time: 1022-1031 PT Time Calculation (min) (ACUTE ONLY): 9 min  Charges:                       G CodesJanyth Contes May 08, 2015, 1:18 PM  Janyth Contes, SPT. [phone]

## 2015-05-05 NOTE — Progress Notes (Signed)
Pt TLC dressing changed on 05/04/2015.

## 2015-05-05 NOTE — Progress Notes (Signed)
Subjective:   He is still having hiccups and mild congestion. The bigger issue at present is that he vomited multiple times today. He is H. pylori positive and has been started on antibiotic therapy. He vomited several times this afternoon but at present is eating dinner without complaint.  Vital signs in last 24 hours: Temp:  [98.6 F (37 C)-99.4 F (37.4 C)] 98.7 F (37.1 C) (07/08 1619) Pulse Rate:  [86-94] 86 (07/08 1619) Resp:  [18-20] 18 (07/08 1619) BP: (130-157)/(64-76) 130/64 mmHg (07/08 1619) SpO2:  [97 %-98 %] 97 % (07/08 1619) Last BM Date: 05/05/15  Intake/Output from previous day: 07/07 0701 - 07/08 0700 In: 959 [P.O.:600; I.V.:359] Out: 2100 [Urine:2100]  Exam:  Abdomen is soft with no significant abnormalities. He does have a little bit of wound drainage in the midline.  Lab Results:  CBC  Recent Labs  05/03/15 0605 05/05/15 0505  WBC 23.6* 16.8*  HGB 9.8* 9.2*  HCT 29.5* 27.1*  PLT 313 376   CMP     Component Value Date/Time   NA 143 05/01/2015 0518   K 3.6 05/01/2015 0518   CL 109 05/01/2015 0518   CO2 28 05/01/2015 0518   GLUCOSE 92 05/01/2015 0518   BUN 13 05/01/2015 0518   CREATININE 0.85 05/01/2015 0518   CALCIUM 7.6* 05/01/2015 0518   PROT 8.2* 04/24/2015 1634   ALBUMIN 4.2 04/24/2015 1634   AST 33 04/24/2015 1634   ALT 17 04/24/2015 1634   ALKPHOS 44 04/24/2015 1634   BILITOT 1.0 04/24/2015 1634   GFRNONAA >60 05/01/2015 0518   GFRAA >60 05/01/2015 0518   PT/INR No results for input(s): LABPROT, INR in the last 72 hours.  Studies/Results: No results found.  Assessment/Plan: I am concerned he has intra-abdominal abscess responsible for his elevated white blood cell count and pickups. The persistent vomiting is a problem. I will speak to GI about possibly considering endoscopy but they may be reluctant to in light of his most recent bleed and perforation. I will set him up for CT scan tomorrow to see what the results are with regard  to his postoperative abdominal status. This plan has been discussed with the patient and his family.

## 2015-05-05 NOTE — Consult Note (Addendum)
Subjective: Patient seen for hematemesis and melena. Patient is doing well. He was moved back to regularsoft diet this afternoon and he is doing well with this. Denies any nausea or abdominal pain.he had a bowel movement today that was described as brown.  Objective: Vital signs in last 24 hours: Temp:  [98.6 F (37 C)-99.4 F (37.4 C)] 98.7 F (37.1 C) (07/08 1619) Pulse Rate:  [86-94] 86 (07/08 1619) Resp:  [18-20] 18 (07/08 1619) BP: (130-157)/(64-76) 130/64 mmHg (07/08 1619) SpO2:  [97 %-98 %] 97 % (07/08 1619) Blood pressure 130/64, pulse 86, temperature 98.7 F (37.1 C), temperature source Oral, resp. rate 18, height 6' (1.829 m), weight 70.308 kg (155 lb), SpO2 97 %.   Intake/Output from previous day: 07/07 0701 - 07/08 0700 In: 959 [P.O.:600; I.V.:359] Out: 2100 [Urine:2100]  Intake/Output this shift: Total I/O In: 290 [P.O.:290] Out: 100 [Urine:100]   General appearance:  81 year old male no acute distress Resp:  clear to auscultation Cardio:  Regular rate and rhythm GI:  Soft nontender dressed incision line midline bowel sounds positive normoactive Extremities:  Clubbing cyanosis or edema   Lab Results: Results for orders placed or performed during the hospital encounter of 04/24/15 (from the past 24 hour(s))  CBC     Status: Abnormal   Collection Time: 05/05/15  5:05 AM  Result Value Ref Range   WBC 16.8 (H) 3.8 - 10.6 K/uL   RBC 2.92 (L) 4.40 - 5.90 MIL/uL   Hemoglobin 9.2 (L) 13.0 - 18.0 g/dL   HCT 09.827.1 (L) 11.940.0 - 14.752.0 %   MCV 92.7 80.0 - 100.0 fL   MCH 31.5 26.0 - 34.0 pg   MCHC 33.9 32.0 - 36.0 g/dL   RDW 82.915.0 (H) 56.211.5 - 13.014.5 %   Platelets 376 150 - 440 K/uL      Recent Labs  05/03/15 0605 05/05/15 0505  WBC 23.6* 16.8*  HGB 9.8* 9.2*  HCT 29.5* 27.1*  PLT 313 376   BMET No results for input(s): NA, K, CL, CO2, GLUCOSE, BUN, CREATININE, CALCIUM in the last 72 hours. LFT No results for input(s): PROT, ALBUMIN, AST, ALT, ALKPHOS, BILITOT,  BILIDIR, IBILI in the last 72 hours. PT/INR No results for input(s): LABPROT, INR in the last 72 hours. Hepatitis Panel No results for input(s): HEPBSAG, HCVAB, HEPAIGM, HEPBIGM in the last 72 hours. C-Diff No results for input(s): CDIFFTOX in the last 72 hours. No results for input(s): CDIFFPCR in the last 72 hours.   Studies/Results: No results found.  Scheduled Inpatient Medications:   . cephALEXin  500 mg Oral 3 times per day  . chlorpheniramine-HYDROcodone  5 mL Oral Q12H  . pantoprazole (PROTONIX) IV  40 mg Intravenous Q12H  . simvastatin  40 mg Oral QHS    Continuous Inpatient Infusions:     PRN Inpatient Medications:  acetaminophen, chlorproMAZINE (THORAZINE) IV, HYDROmorphone (DILAUDID) injection, iohexol, [START ON 05/06/2015] iohexol, ondansetron **OR** ondansetron (ZOFRAN) IV, oxyCODONE-acetaminophen  Miscellaneous:   Assessment:  1. Gastric ulcer, status post Cheree DittoGraham patch. 2. Upper GI bleed not recurrent, stable currently. Of note he is Helicobacter pylori positive IgG and A.  Plan:  1. Recommend the following for his Helicobacter pylori eradication: Biaxin 500 mg twice a day for 14 days,amoxicillin 1 g 2 times a day for 14 days. Continue protonix 40 mg by mouth twice a day for a month then decrease to once daily. Would continue that.   Pastient currently on keflex.  He will need to start h pylori  regimen after this.   We'll sign off.Dr Shelle Iron rounding this weekend if needed.  [name] MD 05/05/2015, 6:32 PM

## 2015-05-06 ENCOUNTER — Encounter: Payer: Self-pay | Admitting: Radiology

## 2015-05-06 ENCOUNTER — Inpatient Hospital Stay: Payer: Medicare Other

## 2015-05-06 LAB — CBC
HCT: 26.3 % — ABNORMAL LOW (ref 40.0–52.0)
Hemoglobin: 8.7 g/dL — ABNORMAL LOW (ref 13.0–18.0)
MCH: 30.6 pg (ref 26.0–34.0)
MCHC: 33.1 g/dL (ref 32.0–36.0)
MCV: 92.4 fL (ref 80.0–100.0)
Platelets: 389 10*3/uL (ref 150–440)
RBC: 2.85 MIL/uL — ABNORMAL LOW (ref 4.40–5.90)
RDW: 14.9 % — AB (ref 11.5–14.5)
WBC: 13.8 10*3/uL — ABNORMAL HIGH (ref 3.8–10.6)

## 2015-05-06 LAB — BASIC METABOLIC PANEL
ANION GAP: 6 (ref 5–15)
BUN: 5 mg/dL — ABNORMAL LOW (ref 6–20)
CALCIUM: 7.4 mg/dL — AB (ref 8.9–10.3)
CO2: 31 mmol/L (ref 22–32)
Chloride: 102 mmol/L (ref 101–111)
Creatinine, Ser: 0.72 mg/dL (ref 0.61–1.24)
GFR calc Af Amer: >60 mL/min (ref >60)
GLUCOSE: 93 mg/dL (ref 65–99)
Potassium: 3 mmol/L — ABNORMAL LOW (ref 3.5–5.1)
Sodium: 139 mmol/L (ref 135–145)

## 2015-05-06 MED ORDER — IOHEXOL 300 MG/ML  SOLN
100.0000 mL | Freq: Once | INTRAMUSCULAR | Status: AC | PRN
  Administered 2015-05-06: 100 mL via INTRAVENOUS

## 2015-05-06 MED ORDER — AMOXICILLIN 500 MG PO CAPS
1000.0000 mg | ORAL_CAPSULE | Freq: Two times a day (BID) | ORAL | Status: DC
  Administered 2015-05-06 – 2015-05-08 (×4): 1000 mg via ORAL
  Filled 2015-05-06 (×6): qty 2

## 2015-05-06 MED ORDER — PANTOPRAZOLE SODIUM 40 MG PO TBEC
40.0000 mg | DELAYED_RELEASE_TABLET | Freq: Two times a day (BID) | ORAL | Status: DC
  Administered 2015-05-06 – 2015-05-08 (×4): 40 mg via ORAL
  Filled 2015-05-06 (×4): qty 1

## 2015-05-06 MED ORDER — CLARITHROMYCIN 500 MG PO TABS
500.0000 mg | ORAL_TABLET | Freq: Two times a day (BID) | ORAL | Status: DC
  Administered 2015-05-06 – 2015-05-08 (×4): 500 mg via ORAL
  Filled 2015-05-06 (×6): qty 1

## 2015-05-06 NOTE — Progress Notes (Signed)
12 Days Post-Op   Subjective:  He has not vomited overnight. He still has the hiccups but is able to eat dinner last night without problem. He's had no emesis since today lodged. CT scan today does not demonstrate any obstruction although there is significant thickening in the prepyloric area and duodenum. There is no evidence of any abscess. He does not have any significant subphrenic or subhepatic collection of fluid. The graft is still having some mild wound drainage from the midline.  Vital signs in last 24 hours: Temp:  [97.8 F (36.6 C)-98.7 F (37.1 C)] 98.5 F (36.9 C) (07/09 0808) Pulse Rate:  [78-86] 78 (07/09 0808) Resp:  [16-18] 17 (07/09 0808) BP: (130-143)/(64-74) 143/74 mmHg (07/09 0808) SpO2:  [97 %-100 %] 100 % (07/09 0808) Last BM Date: 05/05/15  Intake/Output from previous day: 07/08 0701 - 07/09 0700 In: 530 [P.O.:530] Out: 100 [Urine:100]  GI: He has mild erythema around the umbilicus was some  Lab Results:  CBC  Recent Labs  05/05/15 0505 05/06/15 0610  WBC 16.8* 13.8*  HGB 9.2* 8.7*  HCT 27.1* 26.3*  PLT 376 389   CMP     Component Value Date/Time   NA 139 05/06/2015 0610   K 3.0* 05/06/2015 0610   CL 102 05/06/2015 0610   CO2 31 05/06/2015 0610   GLUCOSE 93 05/06/2015 0610   BUN 5* 05/06/2015 0610   CREATININE 0.72 05/06/2015 0610   CALCIUM 7.4* 05/06/2015 0610   PROT 8.2* 04/24/2015 1634   ALBUMIN 4.2 04/24/2015 1634   AST 33 04/24/2015 1634   ALT 17 04/24/2015 1634   ALKPHOS 44 04/24/2015 1634   BILITOT 1.0 04/24/2015 1634   GFRNONAA >60 05/06/2015 0610   GFRAA >60 05/06/2015 0610   PT/INR No results for input(s): LABPROT, INR in the last 72 hours.  Studies/Results: Ct Abdomen W Contrast  05/06/2015   CLINICAL DATA:  Recently postop from surgical repair of perforated duodenal ulcer. Persistent hiccups and vomiting.  EXAM: CT ABDOMEN WITH CONTRAST  TECHNIQUE: Multidetector CT imaging of the abdomen was performed using the standard  protocol following bolus administration of intravenous contrast.  CONTRAST:  OMNIPAQUE IOHEXOL 300 MG/ML  SOLN  COMPARISON:  04/24/2015  FINDINGS: Lower chest: No acute findings. Calcified pleural plaque is again seen in both lung bases, consistent with previous asbestos exposure.  Hepatobiliary: No mass or other parenchymal abnormality identified.  Pancreas: No mass, inflammatory changes, or other parenchymal abnormality identified.  Spleen:  Within normal limits in size and appearance.  Adrenal Glands:  No mass identified.  Kidneys:  No masses identified.  No evidence of hydronephrosis.  Stomach/Bowel/Peritoneum: Visualized portions within the abdomen are unremarkable. No evidence free air or ascites within the abdomen. Residual soft tissue stranding is seen within the gastrocolic ligament, which is most likely due to residual inflammatory or postop changes. No abscess identified.  Vascular/Lymphatic: No pathologically enlarged lymph nodes identified. No abdominal aortic aneurysm or other significant retroperitoneal abnormality demonstrated.  Other:  None.  Musculoskeletal:  No suspicious bone lesions identified.  IMPRESSION: Residual soft tissue stranding and nodularity within the gastrocolic ligament, likely due to residual inflammatory or postop changes. No evidence of abscess or other acute findings within the abdomen.  Bibasilar asbestos related pleural disease again noted.   Electronically Signed   By: Myles Rosenthal M.D.   On: 05/06/2015 09:11    Assessment/Plan: Overall he seems to be doing better. The CT scan does not show any significant fluid collections or  abscess. I'm not certain why continues to have ascitic cups. With the positive H. pylori test we will continue him on antibiotic therapy for recommendations as outlined by GI. He will probably need a repeat endoscopy. I will call his family later today and hopefully we can discharge him by later today or certainly by tomorrow morning.

## 2015-05-06 NOTE — Progress Notes (Signed)
Key Points: Use following P&T approved IV to PO antibiotic change policy.   CONCERNING: IV to Oral Route Change Policy  RECOMMENDATION: This patient is receiving pantoporazole by the intravenous route.  Based on criteria approved by the Pharmacy and Therapeutics Committee, the intravenous medication(s) is/are being converted to the equivalent oral dose form(s).   DESCRIPTION: These criteria include:  The patient is eating (either orally or via tube) and/or has been taking other orally administered medications for a least 24 hours  The patient has no evidence of active gastrointestinal bleeding or impaired GI absorption (gastrectomy, short bowel, patient on TNA or NPO).  If you have questions about this conversion, please contact the Pharmacy Department  []   [phone]( [phone] )  [facility] [x]   [phone]( [phone] )  [facility] []   [phone]( [phone] )  [facility] []   [phone]( [phone] )  [facility] []   [phone]( [phone] )  [facility]   [place],Audrianna Driskill G, [facility] GainesvilleRPH 05/06/2015 1:26 PM

## 2015-05-07 MED ORDER — POTASSIUM CHLORIDE 20 MEQ PO PACK
20.0000 meq | PACK | Freq: Two times a day (BID) | ORAL | Status: DC
  Administered 2015-05-07 – 2015-05-08 (×3): 20 meq via ORAL
  Filled 2015-05-07 (×3): qty 1

## 2015-05-07 NOTE — Progress Notes (Signed)
13 Days Post-Op   Subjective:  He is feeling much better. His hiccups have improved although not stopped completely. His congestion is improving. He is eating well with no further vomiting. Yesterday CT scan did not demonstrate any significant obstruction and no sign of any intra-abdominal abscess. His white blood cell count is down to 13,000.  Vital signs in last 24 hours: Temp:  [98.1 F (36.7 C)-98.9 F (37.2 C)] 98.8 F (37.1 C) (07/10 0854) Pulse Rate:  [82-89] 86 (07/10 0854) Resp:  [18] 18 (07/10 0854) BP: (123-133)/(67-75) 123/72 mmHg (07/10 0854) SpO2:  [96 %-99 %] 96 % (07/10 0854) Last BM Date: 05/05/15  Intake/Output from previous day: 07/09 0701 - 07/10 0700 In: 720 [P.O.:720] Out: -   GI: 7 soft his midline wound looks good. There is no sign of any drainage at the present time. He has active bowel sounds.  Lab Results:  CBC  Recent Labs  05/05/15 0505 05/06/15 0610  WBC 16.8* 13.8*  HGB 9.2* 8.7*  HCT 27.1* 26.3*  PLT 376 389   CMP     Component Value Date/Time   NA 139 05/06/2015 0610   K 3.0* 05/06/2015 0610   CL 102 05/06/2015 0610   CO2 31 05/06/2015 0610   GLUCOSE 93 05/06/2015 0610   BUN 5* 05/06/2015 0610   CREATININE 0.72 05/06/2015 0610   CALCIUM 7.4* 05/06/2015 0610   PROT 8.2* 04/24/2015 1634   ALBUMIN 4.2 04/24/2015 1634   AST 33 04/24/2015 1634   ALT 17 04/24/2015 1634   ALKPHOS 44 04/24/2015 1634   BILITOT 1.0 04/24/2015 1634   GFRNONAA >60 05/06/2015 0610   GFRAA >60 05/06/2015 0610   PT/INR No results for input(s): LABPROT, INR in the last 72 hours.  Studies/Results: Ct Abdomen W Contrast  05/06/2015   CLINICAL DATA:  Recently postop from surgical repair of perforated duodenal ulcer. Persistent hiccups and vomiting.  EXAM: CT ABDOMEN WITH CONTRAST  TECHNIQUE: Multidetector CT imaging of the abdomen was performed using the standard protocol following bolus administration of intravenous contrast.  CONTRAST:  100mL OMNIPAQUE  IOHEXOL 300 MG/ML  SOLN  COMPARISON:  04/24/2015  FINDINGS: Lower chest: No acute findings. Calcified pleural plaque is again seen in both lung bases, consistent with previous asbestos exposure.  Hepatobiliary: No mass or other parenchymal abnormality identified.  Pancreas: No mass, inflammatory changes, or other parenchymal abnormality identified.  Spleen:  Within normal limits in size and appearance.  Adrenal Glands:  No mass identified.  Kidneys:  No masses identified.  No evidence of hydronephrosis.  Stomach/Bowel/Peritoneum: Visualized portions within the abdomen are unremarkable. No evidence free air or ascites within the abdomen. Residual soft tissue stranding is seen within the gastrocolic ligament, which is most likely due to residual inflammatory or postop changes. No abscess identified.  Vascular/Lymphatic: No pathologically enlarged lymph nodes identified. No abdominal aortic aneurysm or other significant retroperitoneal abnormality demonstrated.  Other:  None.  Musculoskeletal:  No suspicious bone lesions identified.  IMPRESSION: Residual soft tissue stranding and nodularity within the gastrocolic ligament, likely due to residual inflammatory or postop changes. No evidence of abscess or other acute findings within the abdomen.  Bibasilar asbestos related pleural disease again noted.   Electronically Signed   By: Myles RosenthalJohn  Stahl M.D.   On: 05/06/2015 09:11    Assessment/Plan:  His family wants him to be transferred to a facility in [place] an [place]. I have been unable to contact the gastroenterologist that they asked me to contact. I  would anticipate that he needs further follow-up as an outpatient but I'm not certain he needs inpatient care at the present time. I'll discussed the situation with the family.

## 2015-05-08 DIAGNOSIS — K631 Perforation of intestine (nontraumatic): Secondary | ICD-10-CM

## 2015-05-08 LAB — CBC
HEMATOCRIT: 27.6 % — AB (ref 40.0–52.0)
Hemoglobin: 9.2 g/dL — ABNORMAL LOW (ref 13.0–18.0)
MCH: 31 pg (ref 26.0–34.0)
MCHC: 33.2 g/dL (ref 32.0–36.0)
MCV: 93.3 fL (ref 80.0–100.0)
Platelets: 449 10*3/uL — ABNORMAL HIGH (ref 150–440)
RBC: 2.96 MIL/uL — AB (ref 4.40–5.90)
RDW: 14.8 % — ABNORMAL HIGH (ref 11.5–14.5)
WBC: 12 10*3/uL — ABNORMAL HIGH (ref 3.8–10.6)

## 2015-05-08 MED ORDER — PANTOPRAZOLE SODIUM 40 MG PO TBEC: 40.0000 mg | DELAYED_RELEASE_TABLET | Freq: Two times a day (BID) | ORAL | Status: DC

## 2015-05-08 MED ORDER — AMOXICILLIN 500 MG PO CAPS: 1000.0000 mg | ORAL_CAPSULE | Freq: Two times a day (BID) | ORAL | Status: AC

## 2015-05-08 MED ORDER — CLARITHROMYCIN 500 MG PO TABS: 500.0000 mg | ORAL_TABLET | Freq: Two times a day (BID) | ORAL | Status: AC

## 2015-05-08 MED ORDER — OXYCODONE-ACETAMINOPHEN 5-325 MG PO TABS: 1.0000 | ORAL_TABLET | Freq: Four times a day (QID) | ORAL | Status: AC | PRN

## 2015-05-08 NOTE — Care Management (Signed)
Important Message  Patient Details  Name: Michael Lowe MRN: [id number] Date of Birth: [birth year]   Medicare Important Message Given:  Yes-second notification given    [name] 05/08/2015, 10:18 AM

## 2015-05-08 NOTE — Progress Notes (Signed)
Postoperative day #13. The patient continues to improve.  Physical therapy feels that he is stable for discharge home.  Filed Vitals:   05/07/15 0854 05/07/15 1756 05/08/15 0041 05/08/15 0821  BP: 123/72 131/77 132/61 123/71  Pulse: 86 85 86 81  Temp: 98.8 F (37.1 C) 98.6 F (37 C) 98.2 F (36.8 C) 98.1 F (36.7 C)  TempSrc: Oral Oral Oral Oral  Resp: 18 18 16 18   Height:      Weight:      SpO2: 96% 95% 97% 99%    CBC Latest Ref Rng 05/08/2015 05/06/2015 05/05/2015  WBC 3.8 - 10.6 K/uL 12.0(H) 13.8(H) 16.8(H)  Hemoglobin 13.0 - 18.0 g/dL 1.6(X9.2(L) 0.9(U8.7(L) 0.4(V9.2(L)  Hematocrit 40.0 - 52.0 % 27.6(L) 26.3(L) 27.1(L)  Platelets 150 - 440 K/uL 449(H) 389 376    Abdomen is soft. Staples were removed. There is no wound drainage. Lungs are clear.  Impression doing very well with complicated postoperative course.  There is currently no further signs of upper GI hemorrhage. I will discuss with the patient and his wife when she arrives regarding his discharged home with perhaps home physical therapy.

## 2015-05-08 NOTE — Discharge Summary (Signed)
Physician Discharge Summary  Patient ID: Michael Lowe MRN: [id number] DOB/AGE: May 02, 1932 81 y.o.  Admit date: 04/24/2015 Discharge date: 05/08/2015  Admission Diagnoses: Perforated viscus Discharge Diagnoses:  Active Problems:   Intestinal perforation   Perforated viscus   Malnutrition of moderate degree  upper GI bleed  Discharged Condition: Stable and improved  Hospital Course: The patient was brought urgently to the operating room the evening of his admission with a perforated viscus. Graham patch repair was performed of a perforated prepyloric channel ulcer. He ultimately tested Helicobacter pylori positive and was treated for this upon discharge. Postoperatively nasogastric tube could not be placed due to prior ENT intervention. Postoperatively patient continued to do well. By postoperative day #7 however the patient had a large amount of hematemesis followed by melanotic stools and a substantial drop in his hemoglobin. He was transferred to the intensive care unit and underwent a total of 4 units of blood transfusion. He never remained medically stable for very long. Upper GI bleeding seemed to stop and he was placed on a Protonix drip. He was able to be transitioned back to the ward where he continue with physical therapy. His diet was able to be advanced after a short course of intravenous total parenteral nutrition. He underwent physical therapy evaluation and treatment in the hospital was deemed suitable for discharge home.  Consults:  Gastroenterology, social work.  Treatments: As above  Discharge Exam: Blood pressure 123/71, pulse 81, temperature 98.1 F (36.7 C), temperature source Oral, resp. rate 18, height 6' (1.829 m), weight 70.308 kg (155 lb), SpO2 99 %.   Disposition: Home with self-care  Discharge Instructions    Call MD for:  persistant nausea and vomiting    Complete by:  As directed      Call MD for:  redness, tenderness, or signs of infection (pain,  swelling, redness, odor or green/yellow discharge around incision site)    Complete by:  As directed      Call MD for:  severe uncontrolled pain    Complete by:  As directed      Call MD for:  temperature >100.4    Complete by:  As directed      Diet general    Complete by:  As directed      Discharge instructions    Complete by:  As directed   DISCHARGE INSTRUCTIONS TO PATIENT  REMINDER:  Carry a list of your medications and allergies with you at all times Call your pharmacy at least 1 week in advance to refill prescriptions Do not mix any prescribed pain medicine with alcohol Do not drive any motor vehicles while taking pain medication. Take medications with food.  Do not retake a pain medication if you vomit after taking it.  Activity: no lifting more than 15 pounds until instructed by your doctor.   Dressing Care Instruction (if applicable):              Remove operative dressings in 48 hours.  May Shower-    Dry Dressing as needed to operative site.    Follow-up appointments (date to return to physician): Call for appointment with Dr. Natale Lay, MD at [phone] or [phone]  If need MD on call after hours and on weekends call Hospital operator at [phone] as ask to speak to Surgeon on call for [facility].  Call Surgeon if you have: Temperature greater than 100.4 Persistent nausea and vomiting Severe uncontrolled pain Redness, tenderness, or signs of infection (pain, swelling, redness,  odor or green/yellow discharge around the site) Difficulty breathing, headache or visual disturbances Hives Persistent dizziness or light-headedness Extreme fatigue Any other questions or concerns you may have after discharge  In an emergency, call 911 or go to an Emergency Department at a nearby hospital  Diet:  Resume your usual diet.  Avoid spicy, greasy or heavy foods.  If you have nausea or vomiting, go back to liquids.  If you cannot keep liquids down,  call your doctor.  Avoid alcohol consumption while on prescription pain medications. Good nutrition promotes healing. Increase fiber and fluids.     I understand and acknowledge receipt of the above instructions.                                                                                                                                        Patient or Guardian Signature                                                                    Date/Time                                                                                                                                        Physician's or R.N.'s Signature                                                                  Date/Time  The discharge instructions have been reviewed with the patient and/or Family Member/Parent/Guardian.  Patient and/or Family Member/Parent/Guardian signed and retained a printed copy.     Increase activity slowly    Complete by:  As directed             Medication List    TAKE these medications        amoxicillin 500 MG capsule  Commonly known as:  AMOXIL  Take 2 capsules (1,000 mg total) by mouth every 12 (twelve) hours.     aspirin EC 81 MG tablet  Take 81 mg by mouth daily.     clarithromycin 500 MG tablet  Commonly known as:  BIAXIN  Take 1 tablet (500 mg total) by mouth every 12 (twelve) hours.     fluticasone 50 MCG/ACT nasal spray  Commonly known as:  FLONASE  Place 1 spray into both nostrils daily.     meloxicam 7.5 MG tablet  Commonly known as:  MOBIC  Take 1 tablet by mouth daily.     oxyCODONE-acetaminophen 5-325 MG per tablet  Commonly known as:  PERCOCET/ROXICET  Take 1-2 tablets by mouth every 6 (six) hours as needed for moderate pain.     pantoprazole 40 MG tablet  Commonly known as:  PROTONIX  Take 1 tablet (40 mg total) by mouth 2 (two) times daily.     simvastatin 40 MG tablet  Commonly known as:  ZOCOR  Take 1 tablet by mouth at bedtime.            Follow-up Information    Follow up with Tiney Rouge III, MD. Go on 05/25/2015.   Specialty:  Surgery   Why:  at 10:00am for hospital follow-up   Contact information:   [address] [place] [address] [phone]       Signed: Natale Lay 05/08/2015, 1:46 PM

## 2015-05-08 NOTE — Clinical Social Work Note (Signed)
Pt is making improvements.  PT has assessed that pt no longer needs physical assistance from PT.  CSW is signing off as there are no further needs at this time.  Radisson[place]ynn [place], [place] [phone]

## 2015-05-08 NOTE — Progress Notes (Signed)
Patient A&O, VSS.  No complaints of pain.  Continuing to tolerate diet well with no complaints of nausea.  BM this a.m.  Up to BR to void independently.  Central line removed; patient tolerated well.  Staples removed per MD order.  Discharge instructions reviewed with patient, wife and daughter.  All questions were answered and understanding was verbalized.  Patient discharged home via wheelchair in stable condition escorted by volunteer staff.

## 2015-05-09 ENCOUNTER — Telehealth: Payer: Self-pay

## 2015-05-09 NOTE — Telephone Encounter (Signed)
Completed Disability paperwork for patient's daughter. Called to inform her it was ready for pick-up.   She lives in [place], so she would like this faxed to her employer: [employer] [phone]. This was done.  She would like a copy sent to her home address: [address], [place], [place] [id number]. This was also done.

## 2015-05-25 ENCOUNTER — Ambulatory Visit (INDEPENDENT_AMBULATORY_CARE_PROVIDER_SITE_OTHER): Payer: Medicare Other | Admitting: Surgery

## 2015-05-25 ENCOUNTER — Encounter: Payer: Self-pay | Admitting: Surgery

## 2015-05-25 VITALS — BP 107/73 | HR 88 | Temp 98.0°F | Ht 72.0 in | Wt 138.0 lb

## 2015-05-25 DIAGNOSIS — K275 Chronic or unspecified peptic ulcer, site unspecified, with perforation: Secondary | ICD-10-CM

## 2015-05-25 MED ORDER — OMEPRAZOLE 20 MG PO CPDR: 20.0000 mg | DELAYED_RELEASE_CAPSULE | Freq: Two times a day (BID) | ORAL | Status: DC

## 2015-05-25 NOTE — Progress Notes (Signed)
Outpatient Surgical Follow Up  05/25/2015  Michael Lowe is an 81 y.o. male. With perforated duodenal gastric ulcer who underwent surgery several weeks ago.  Chief Complaint  Patient presents with  . Routine Post Op    colon resection    HPI: His postoperative course was complicated by a massive GI bleed. He was sent to be a gastric source as it resolved spontaneously. He has not undergone any endoscopy at the present time. He was to pylori positive and has been under treatment for several weeks.  He's feeling well with no significant complaints at the present time. He is eating well and gaining weight. He does have some mild constipation. He has seen no further blood in his stool or and has had no vomiting.  Past Medical History  Diagnosis Date  . Hypercholesteremia     Past Surgical History  Procedure Laterality Date  . Rotator cuff repair    . Repair of perforated ulcer  04/24/2015    Procedure: REPAIR OF PERFORATED ULCER;  Surgeon: Tiney Rouge III, MD;  Location: [facility] ORS;  Service: General;;  duodenal  . Colon resection      Family History  Problem Relation Age of Onset  . Hypertension Mother   . Hypertension Sister     Social History:  reports that he has never smoked. He has never used smokeless tobacco. He reports that he does not drink alcohol or use illicit drugs.  Allergies:  Allergies  Allergen Reactions  . Nsaids Other (See Comments)    Bleeding peptic ulcer with bowel perforation    Medications reviewed.    ROS    BP 107/73 mmHg  Pulse 88  Temp(Src) 98 F (36.7 C) (Oral)  Ht 6' (1.829 m)  Wt 62.596 kg (138 lb)  BMI 18.71 kg/m2  Physical Exam's abdomen is soft with no distention minimal tenderness and good wound healing. Staples were removed. He has active bowel sounds.     No results found for this or any previous visit (from the past 48 hour(s)). No results found.  Assessment/Plan:  1. Perforated peptic ulcer He is doing very well  post surgery. We will continue his PPI drugs. As he lives in [place] we will recommend to the family that they consider having him meet with primary care physician and arrange for eventual endoscopy to assure resolution of the ulcer and rule out any evidence for malignancy. They're in agreement.     Tiney Rouge III  05/25/2015,negative

## 2015-06-30 ENCOUNTER — Other Ambulatory Visit: Payer: Self-pay | Admitting: *Deleted

## 2015-06-30 DIAGNOSIS — K279 Peptic ulcer, site unspecified, unspecified as acute or chronic, without hemorrhage or perforation: Secondary | ICD-10-CM

## 2015-06-30 MED ORDER — OMEPRAZOLE 20 MG PO CPDR: 20.0000 mg | DELAYED_RELEASE_CAPSULE | Freq: Two times a day (BID) | ORAL | Status: AC

## 2015-09-24 ENCOUNTER — Other Ambulatory Visit: Payer: Self-pay | Admitting: Surgery

## 2015-10-07 ENCOUNTER — Other Ambulatory Visit: Payer: Self-pay | Admitting: Surgery

## 2017-03-20 IMAGING — CT CT ABDOMEN W/ CM
1 of 3 series · 13 of 32 positions shown, 19 images · IV contrast (omnipaque)
Comparison: 04/24/2015

CLINICAL DATA: Recently postop from surgical repair of perforated
duodenal ulcer. Persistent hiccups and vomiting.

EXAM:
CT ABDOMEN WITH CONTRAST
TECHNIQUE: Multidetector CT imaging of the abdomen was performed using the
standard protocol following bolus administration of intravenous
contrast.
CONTRAST:  100mL OMNIPAQUE IOHEXOL 300 MG/ML  SOLN

[Series 4: routine abd pel with. · axial · 0.59mm/px · z∈[+531,+726]mm · 13 of 46 slices shown, 19 images]
[im 4/46  soft-tissue]
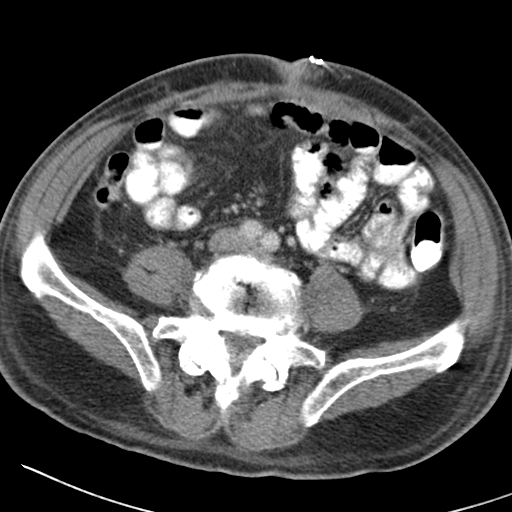
[im 4/46  bone]
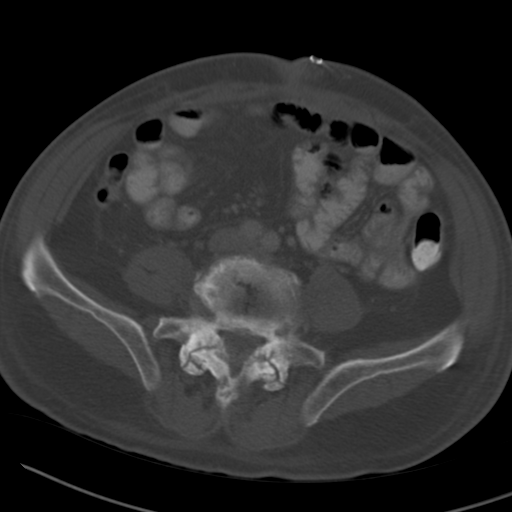
[im 7/46  soft-tissue]
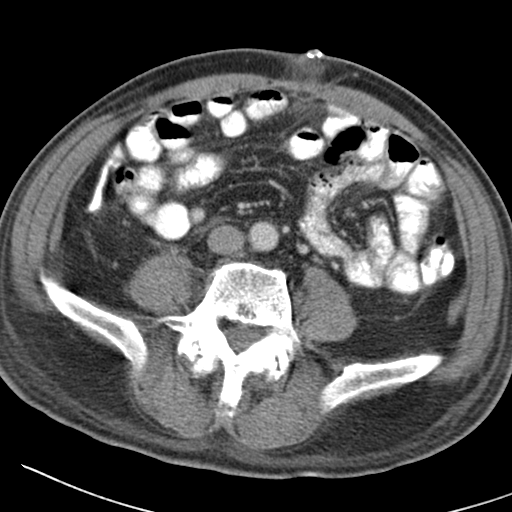
[im 10/46  soft-tissue]
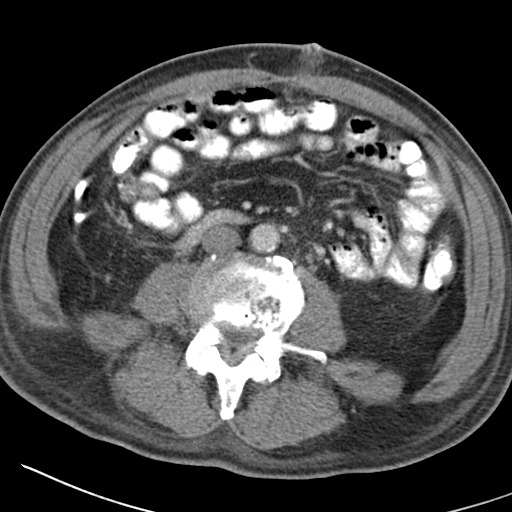
[im 13/46  soft-tissue]
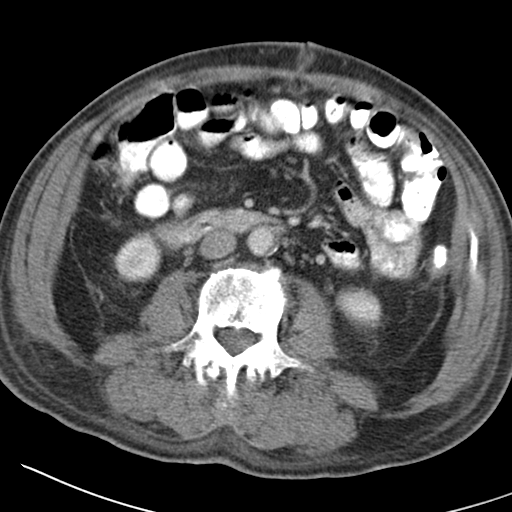
[im 16/46  soft-tissue]
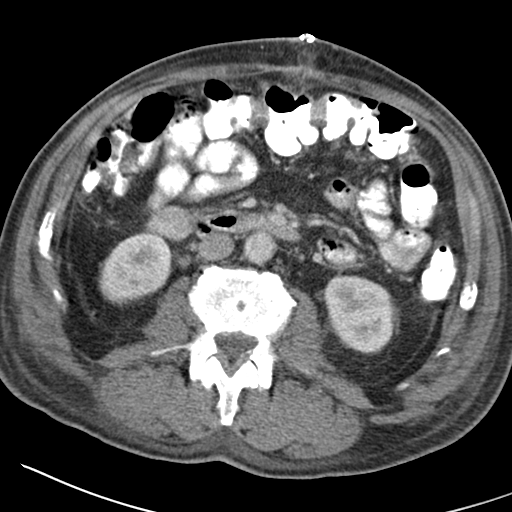
[im 19/46  soft-tissue]
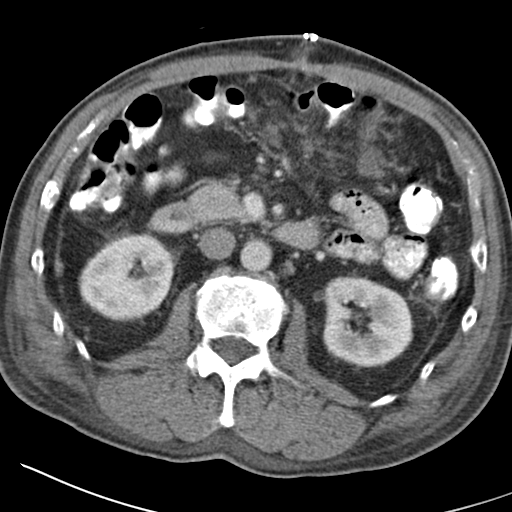
[im 25/46  soft-tissue]
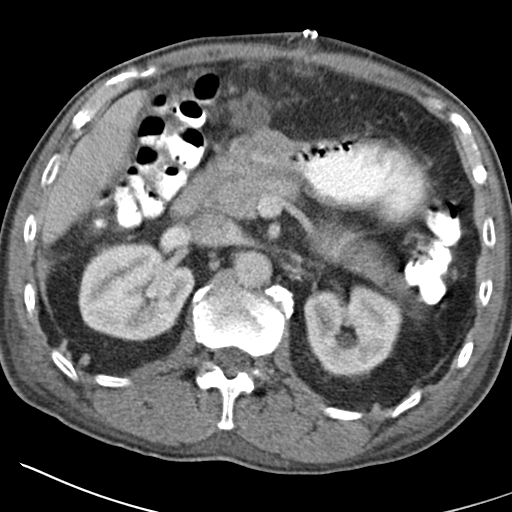
[im 28/46  soft-tissue]
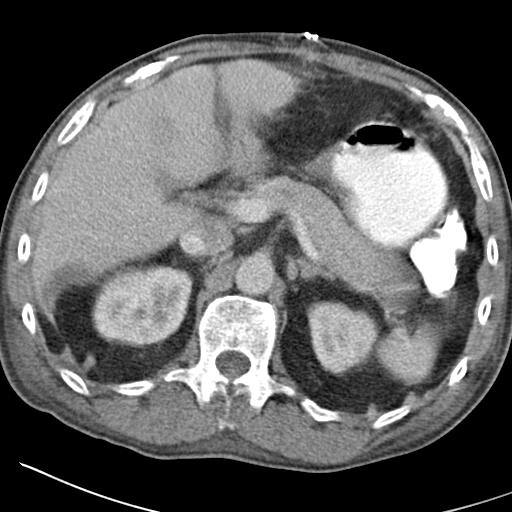
[im 31/46  soft-tissue]
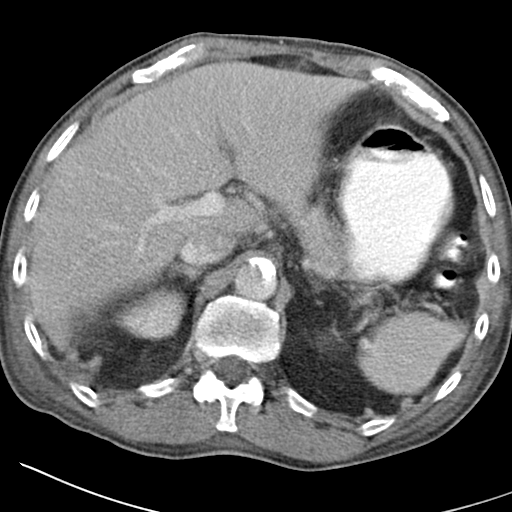
[im 31/46  bone]
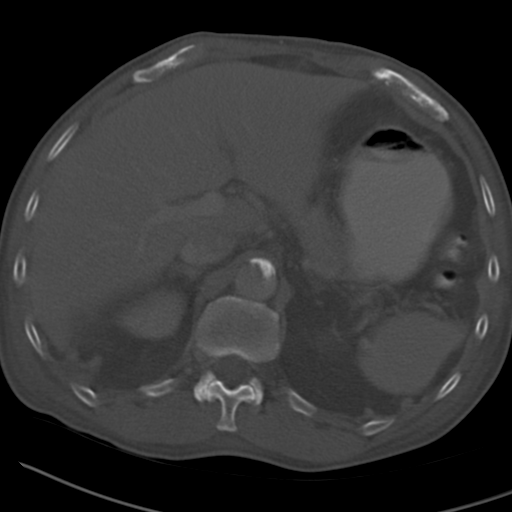
[im 34/46  soft-tissue]
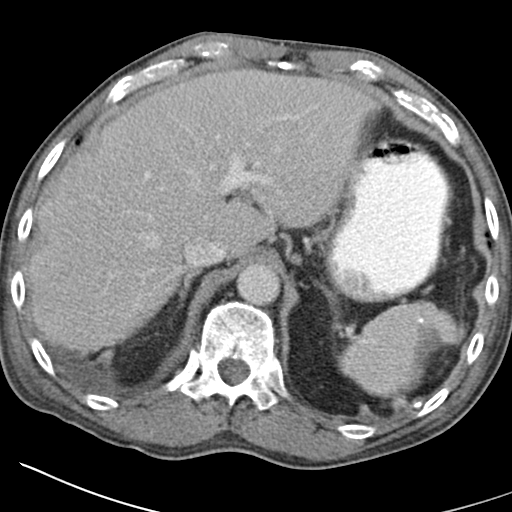
[im 34/46  lung]
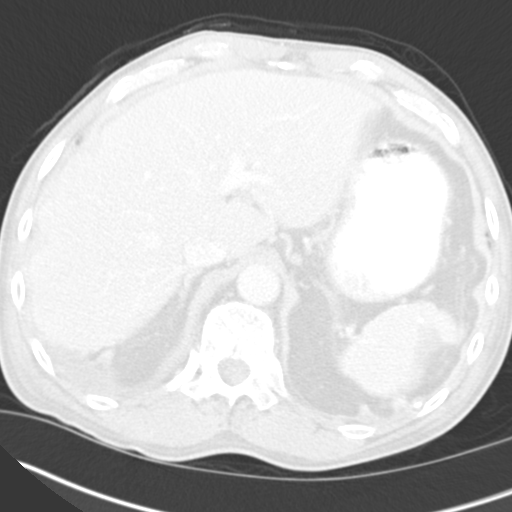
[im 37/46  soft-tissue]
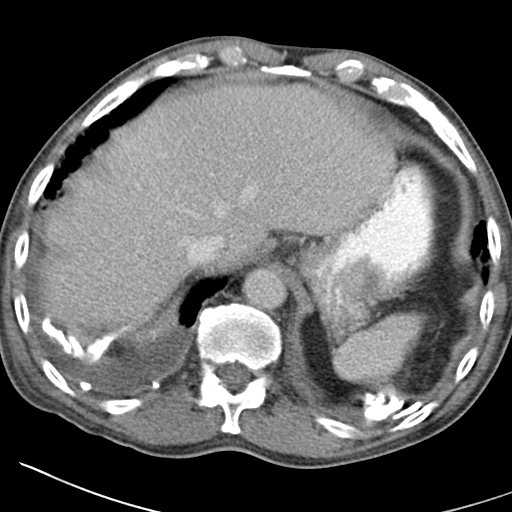
[im 37/46  lung]
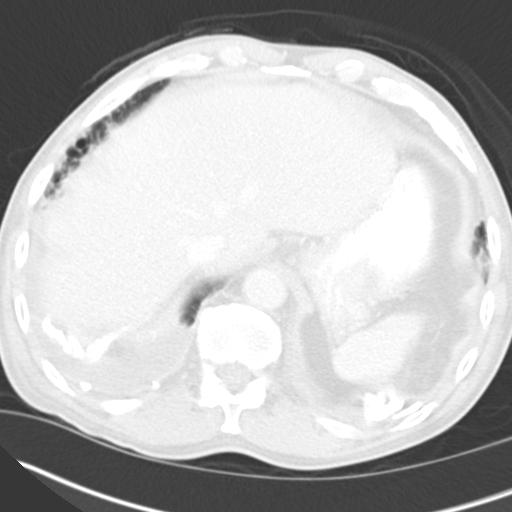
[im 40/46  soft-tissue]
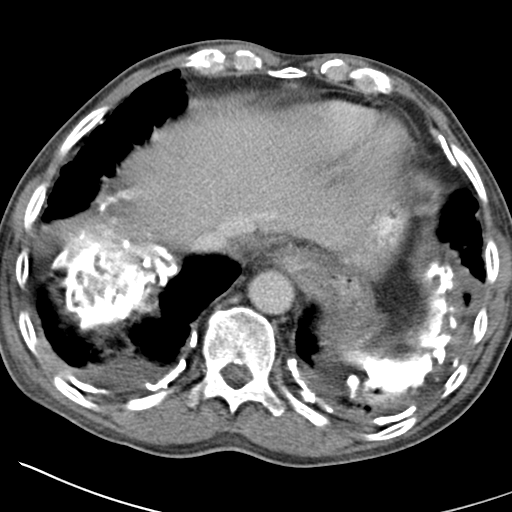
[im 40/46  lung]
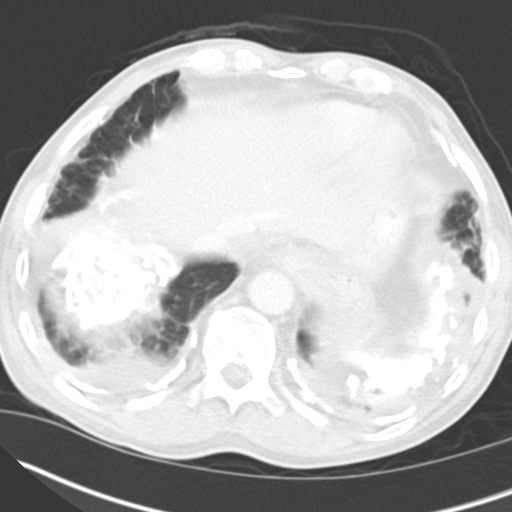
[im 43/46  soft-tissue]
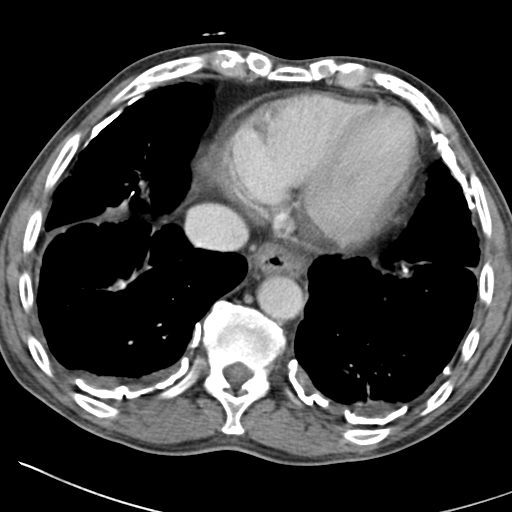
[im 43/46  lung]
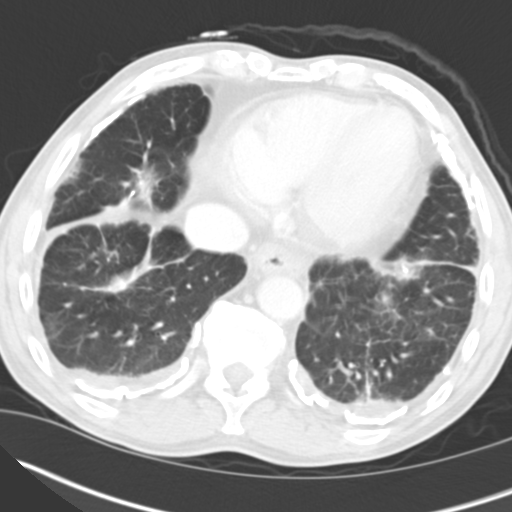

[13 of 32 positions shown; findings below may reference images not displayed]

FINDINGS: Lower chest: No acute findings. Calcified pleural plaque is again
seen in both lung bases, consistent with previous asbestos exposure.

Hepatobiliary: No mass or other parenchymal abnormality identified.

Pancreas: No mass, inflammatory changes, or other parenchymal
abnormality identified.

Spleen:  Within normal limits in size and appearance.

Adrenal Glands:  No mass identified.

Kidneys:  No masses identified.  No evidence of hydronephrosis.

Stomach/Bowel/Peritoneum: Visualized portions within the abdomen are
unremarkable. No evidence free air or ascites within the abdomen.
Residual soft tissue stranding is seen within the gastrocolic
ligament, which is most likely due to residual inflammatory or
postop changes. No abscess identified.

Vascular/Lymphatic: No pathologically enlarged lymph nodes
identified. No abdominal aortic aneurysm or other significant
retroperitoneal abnormality demonstrated.

Other:  None.

Musculoskeletal:  No suspicious bone lesions identified.
IMPRESSION: Residual soft tissue stranding and nodularity within the gastrocolic
ligament, likely due to residual inflammatory or postop changes. No
evidence of abscess or other acute findings within the abdomen.

Bibasilar asbestos related pleural disease again noted.

## 2023-07-29 DEATH — deceased
